# Patient Record
Sex: Female | Born: 1980 | Race: White | Hispanic: No | Smoking: Current every day smoker
Health system: Southern US, Community
[De-identification: ages and names within clinical notes are randomized; demographics above are authoritative.]

## PROBLEM LIST (undated history)

## (undated) DIAGNOSIS — R87629 Unspecified abnormal cytological findings in specimens from vagina: Secondary | ICD-10-CM

## (undated) DIAGNOSIS — E119 Type 2 diabetes mellitus without complications: Secondary | ICD-10-CM

## (undated) HISTORY — PX: ECTOPIC PREGNANCY SURGERY: SHX613

## (undated) HISTORY — DX: Unspecified abnormal cytological findings in specimens from vagina: R87.629

---

## 2010-05-29 ENCOUNTER — Ambulatory Visit: Payer: Self-pay | Admitting: Internal Medicine

## 2011-02-11 ENCOUNTER — Ambulatory Visit: Payer: Self-pay | Admitting: Internal Medicine

## 2012-08-27 ENCOUNTER — Ambulatory Visit: Payer: Self-pay

## 2013-06-17 ENCOUNTER — Ambulatory Visit: Payer: Self-pay | Admitting: Internal Medicine

## 2013-06-17 LAB — RAPID STREP-A WITH REFLX: Micro Text Report: NEGATIVE

## 2013-06-20 LAB — BETA STREP CULTURE(ARMC)

## 2013-08-31 ENCOUNTER — Ambulatory Visit: Payer: Self-pay | Admitting: Family Medicine

## 2013-09-06 ENCOUNTER — Encounter: Payer: Self-pay | Admitting: Family Medicine

## 2014-03-20 IMAGING — CR DG CHEST 1V
1 series · 1 of 1 positions shown · non-contrast
Comparison: none

REASON FOR EXAM: L rib pain
COMMENTS:

PROCEDURE:     MDR - MDR CHEST 1 VIEW AP OR PA  - August 27, 2012 [DATE]
RESULT:     Comparison: None.

[pa]
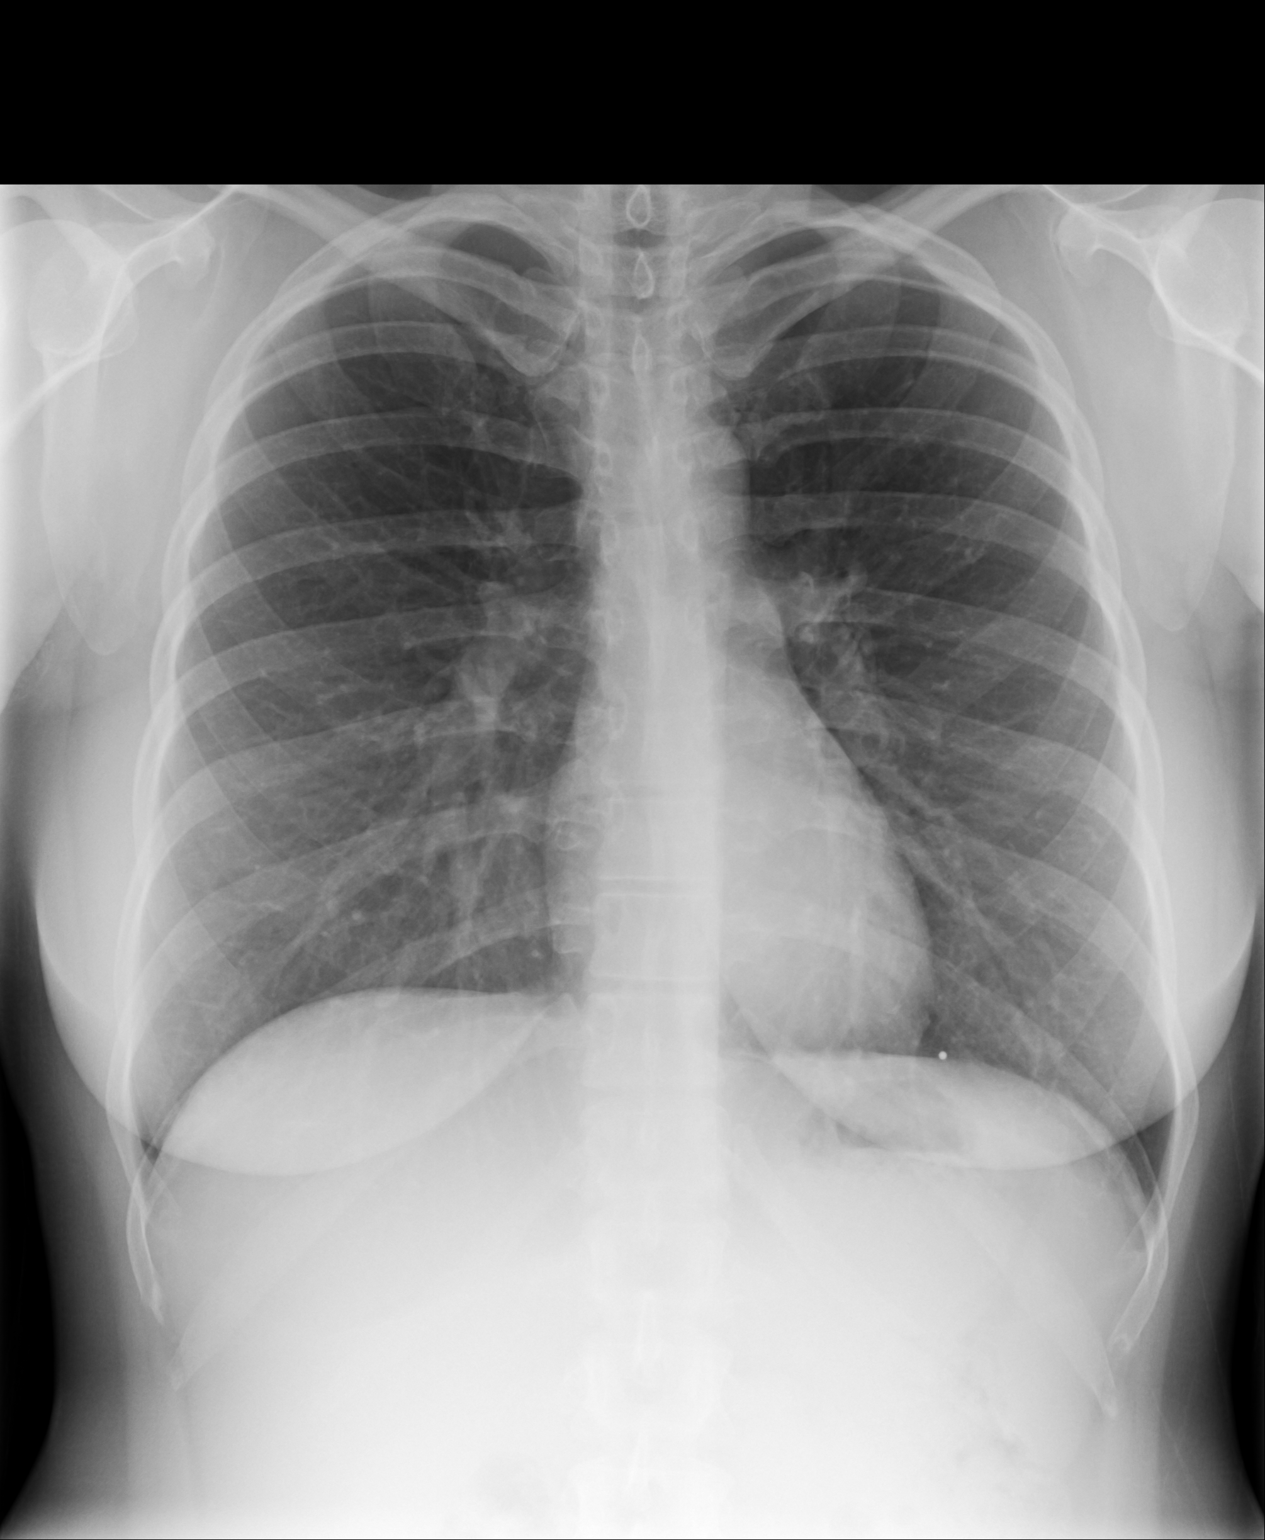

[1 of 1 positions shown; findings below may reference images not displayed]

FINDINGS: The heart and mediastinum are within normal limits. No focal pulmonary
opacities.
IMPRESSION: No acute cardiopulmonary disease.

[REDACTED]

## 2015-12-25 DIAGNOSIS — E119 Type 2 diabetes mellitus without complications: Secondary | ICD-10-CM | POA: Insufficient documentation

## 2016-04-22 DIAGNOSIS — N939 Abnormal uterine and vaginal bleeding, unspecified: Secondary | ICD-10-CM | POA: Insufficient documentation

## 2017-02-05 DIAGNOSIS — F325 Major depressive disorder, single episode, in full remission: Secondary | ICD-10-CM | POA: Insufficient documentation

## 2018-03-19 DIAGNOSIS — E119 Type 2 diabetes mellitus without complications: Secondary | ICD-10-CM | POA: Insufficient documentation

## 2018-10-29 DIAGNOSIS — Z720 Tobacco use: Secondary | ICD-10-CM | POA: Insufficient documentation

## 2019-02-22 ENCOUNTER — Ambulatory Visit
Admission: EM | Admit: 2019-02-22 | Discharge: 2019-02-22 | Disposition: A | Discharge status: Home / Self Care | Payer: Medicaid Other | Attending: Family Medicine | Admitting: Family Medicine

## 2019-02-22 ENCOUNTER — Other Ambulatory Visit: Payer: Self-pay

## 2019-02-22 DIAGNOSIS — N390 Urinary tract infection, site not specified: Secondary | ICD-10-CM | POA: Diagnosis present

## 2019-02-22 DIAGNOSIS — N760 Acute vaginitis: Secondary | ICD-10-CM | POA: Insufficient documentation

## 2019-02-22 DIAGNOSIS — B9689 Other specified bacterial agents as the cause of diseases classified elsewhere: Secondary | ICD-10-CM | POA: Insufficient documentation

## 2019-02-22 HISTORY — DX: Type 2 diabetes mellitus without complications: E11.9

## 2019-02-22 LAB — WET PREP, GENITAL
Sperm: NONE SEEN
Trich, Wet Prep: NONE SEEN
Yeast Wet Prep HPF POC: NONE SEEN

## 2019-02-22 LAB — URINALYSIS, COMPLETE (UACMP) WITH MICROSCOPIC
Bilirubin Urine: NEGATIVE
Glucose, UA: >1000 mg/dL — AB
Ketones, ur: NEGATIVE mg/dL
Leukocytes,Ua: NEGATIVE
Nitrite: NEGATIVE
Protein, ur: NEGATIVE mg/dL
Specific Gravity, Urine: 1.02 (ref 1.005–1.030)
pH: 5.5 (ref 5.0–8.0)

## 2019-02-22 MED ORDER — METRONIDAZOLE 500 MG PO TABS: 500.0000 mg | ORAL_TABLET | Freq: Two times a day (BID) | ORAL | 0 refills | Status: DC

## 2019-02-22 MED ORDER — CEPHALEXIN 500 MG PO CAPS: 500.0000 mg | ORAL_CAPSULE | Freq: Two times a day (BID) | ORAL | 0 refills | Status: AC

## 2019-02-22 NOTE — ED Provider Notes (Addendum)
[facility] ____________________________________________  Time seen: Approximately 11:51 AM  I have reviewed the triage vital signs and the nursing notes.   HISTORY  Chief Complaint Dysuria   HPI MORENIKE CUFF is a 38 y.o. female presenting for evaluation of 3 days of urinary frequency, urinary urgency and burning with urination.  Also reports noticing odor to urine but also odor vaginally as well.  Denies atypical vaginal discharge.  Currently finishing her menstrual cycle.  Expresses concern request gonorrhea and Chlamydia testing to be completed as her boyfriend recently cheated on her.  Declines HIV or RPR testing.  Denies abdominal pain, back pain, fevers, vomiting or diarrhea.  Continues to eat and drink well.  Denies other aggravating or alleviating factors.  Patient also reports blood sugars have been doing well, stating blood sugar has been in the "100s ".    Past Medical History:  Diagnosis Date  . Diabetes mellitus without complication (HCC)     There are no active problems to display for this patient.   Past Surgical History:  Procedure Laterality Date  . CESAREAN SECTION    . ECTOPIC PREGNANCY SURGERY       No current facility-administered medications for this encounter.   Current Outpatient Medications:  .  insulin glargine (LANTUS) 100 UNIT/ML injection, Take 50units  nightly., Disp: , Rfl:  .  liraglutide (VICTOZA) 18 MG/3ML SOPN, Inject into the skin., Disp: , Rfl:  .  cephALEXin (KEFLEX) 500 MG capsule, Take 1 capsule (500 mg total) by mouth 2 (two) times daily for 7 days., Disp: 14 capsule, Rfl: 0 .  metroNIDAZOLE (FLAGYL) 500 MG tablet, Take 1 tablet (500 mg total) by mouth 2 (two) times daily., Disp: 14 tablet, Rfl: 0  Allergies Metformin, Hydrocodone, and Hydrocodone-acetaminophen  History reviewed. No pertinent family history.  Social History Social History   Tobacco Use  . Smoking status: Current Every Day Smoker   Packs/day: 0.50    Types: Cigarettes  . Smokeless tobacco: Never Used  Substance Use Topics  . Alcohol use: Yes    Comment: social  . Drug use: Never    Review of Systems Constitutional: No fever ENT: No sore throat. Cardiovascular: Denies chest pain. Respiratory: Denies shortness of breath. Gastrointestinal: No abdominal pain.  No nausea, no vomiting.  No diarrhea.   Genitourinary: Positive for dysuria. Musculoskeletal: Negative for back pain. Skin: Negative for rash.   ____________________________________________   PHYSICAL EXAM:  VITAL SIGNS: ED Triage Vitals  Enc Vitals Group     BP 02/22/19 1105 126/84     Pulse Rate 02/22/19 1105 93     Resp 02/22/19 1105 16     Temp 02/22/19 1105 98.2 F (36.8 C)     Temp Source 02/22/19 1105 Oral     SpO2 02/22/19 1105 100 %     Weight 02/22/19 1107 135 lb (61.2 kg)     Height 02/22/19 1107 5' (1.524 m)     Head Circumference --      Peak Flow --      Pain Score 02/22/19 1106 10     Pain Loc --      Pain Edu? --      Excl. in GC? --     Constitutional: Alert and oriented. Well appearing and in no acute distress. Eyes: Conjunctivae are normal.  ENT      Head: Normocephalic and atraumatic. Cardiovascular: Normal rate, regular rhythm. Grossly normal heart sounds.  Good peripheral circulation. Respiratory: Normal respiratory effort without tachypnea  nor retractions. Breath sounds are clear and equal bilaterally. No wheezes, rales, rhonchi. Gastrointestinal: Soft and nontender.No CVA tenderness. Musculoskeletal: No midline cervical, thoracic or lumbar tenderness to palpation.  Neurologic:  Normal speech and language. Speech is normal. No gait instability.  Skin:  Skin is warm, dry and intact. No rash noted. Psychiatric: Mood and affect are normal. Speech and behavior are normal. Patient exhibits appropriate insight and judgment   ___________________________________________   LABS (all labs ordered are listed, but only  abnormal results are displayed)  Labs Reviewed  WET PREP, GENITAL - Abnormal; Notable for the following components:      Result Value   Clue Cells Wet Prep HPF POC PRESENT (*)    WBC, Wet Prep HPF POC FEW (*)    All other components within normal limits  URINALYSIS, COMPLETE (UACMP) WITH MICROSCOPIC - Abnormal; Notable for the following components:   Glucose, UA >1,000 (*)    Hgb urine dipstick SMALL (*)    Bacteria, UA FEW (*)    All other components within normal limits  GC/CHLAMYDIA PROBE AMP  URINE CULTURE   PROCEDURES Procedures   INITIAL IMPRESSION / ASSESSMENT AND PLAN / ED COURSE  Pertinent labs & imaging results that were available during my care of the patient were reviewed by me and considered in my medical decision making (see chart for details).  Well appearing patient. Elected for self wet prep and self gc/chlamydia. Urinalysis reviewed, will culture.  Wet prep positive for clue cells.  Will treat patient with oral Keflex and Flagyl.  Encourage rest, fluids, supportive care, pelvic rest.  Awaiting gonorrhea and Chlamydia testing.Discussed indication, risks and benefits of medications with patient.   Discussed follow up with Primary care physician this week as needed. Discussed follow up and return parameters including no resolution or any worsening concerns. Patient verbalized understanding and agreed to plan.   ____________________________________________   FINAL CLINICAL IMPRESSION(S) / ED DIAGNOSES  Final diagnoses:  Urinary tract infection without hematuria, site unspecified  Bacterial vaginosis     ED Discharge Orders         Ordered    metroNIDAZOLE (FLAGYL) 500 MG tablet  2 times daily     02/22/19 1147    cephALEXin (KEFLEX) 500 MG capsule  2 times daily     02/22/19 1147           Note: This dictation was prepared with Dragon dictation along with smaller phrase technology. Any transcriptional errors that result from this process are  unintentional.         Marylene Land, NP 02/22/19 1237    Marylene Land, NP 02/22/19 [phone]

## 2019-02-22 NOTE — ED Triage Notes (Addendum)
3 days of burning with urination. Pt reports her boyfriend cheated on her and she has had sex since then. Pt reports vaginal odor.

## 2019-02-22 NOTE — Discharge Instructions (Addendum)
Take medication as prescribed. Rest. Drink plenty of fluids. Pelvic rest.  ° °Follow up with your primary care physician this week as needed. Return to Urgent care for new or worsening concerns.  ° °

## 2019-02-24 LAB — URINE CULTURE: Culture: 60000 — AB

## 2019-02-25 LAB — GC/CHLAMYDIA PROBE AMP
Chlamydia trachomatis, NAA: POSITIVE — AB
Neisseria Gonorrhoeae by PCR: NEGATIVE

## 2019-02-26 ENCOUNTER — Telehealth: Payer: Self-pay | Admitting: Emergency Medicine

## 2019-02-26 MED ORDER — AZITHROMYCIN 250 MG PO TABS: 1000.0000 mg | ORAL_TABLET | Freq: Once | ORAL | 0 refills | Status: AC

## 2019-02-26 NOTE — Telephone Encounter (Signed)
Chlamydia is positive.  Rx po zithromax 1g #1 dose no refills was sent to the pharmacy of record.  Pt needs education to please refrain from sexual intercourse for 7 days to give the medicine time to work, sexual partners need to be notified and tested/treated.  Condoms may reduce risk of reinfection.  Recheck or followup with PCP for further evaluation if symptoms are not improving.   [facility] notified.  Patient contacted and made aware of    results. Pt verbalized understanding and had all questions answered.

## 2019-03-15 ENCOUNTER — Ambulatory Visit: Payer: Medicaid Other | Admitting: Physician Assistant

## 2019-03-15 ENCOUNTER — Other Ambulatory Visit: Payer: Self-pay

## 2019-03-15 DIAGNOSIS — Z113 Encounter for screening for infections with a predominantly sexual mode of transmission: Secondary | ICD-10-CM | POA: Diagnosis not present

## 2019-03-15 DIAGNOSIS — Z792 Long term (current) use of antibiotics: Secondary | ICD-10-CM

## 2019-03-15 LAB — WET PREP FOR TRICH, YEAST, CLUE
Trichomonas Exam: NEGATIVE
Yeast Exam: NEGATIVE

## 2019-03-15 MED ORDER — AZITHROMYCIN 500 MG PO TABS
1000.0000 mg | ORAL_TABLET | Freq: Once | ORAL | Status: AC
  Administered 2019-03-15: 1000 mg via ORAL

## 2019-03-15 NOTE — Progress Notes (Signed)
Wet prep reviewed-no Tx indicated; Azithromycin 1 gm po DOT per C. [place], [place] Debera Lat, [place]

## 2019-03-16 ENCOUNTER — Encounter: Payer: Self-pay | Admitting: Physician Assistant

## 2019-03-16 NOTE — Progress Notes (Signed)
STI clinic/screening visit  Subjective:  Beth Clarke is a 38 y.o. female being seen today for an STI screening visit. The patient reports they do have symptoms.  Patient reports that they do not desire a pregnancy in the next year.   They reported they are not interested in discussing contraception today.   Patient has the following medical conditions:  There are no active problems to display for this patient.    Chief Complaint  Patient presents with  . SEXUALLY TRANSMITTED DISEASE    HPI  Patient reports that she was treated for Chlamydia last Thursday but had sex with an untreated partner last night.  States that she has a vaginal odor today and she wants to be checked.  Using condoms for [facility] and declines to discuss BCM today.  LMP 02/11/2019 and normal.  See flowsheet for further details and programmatic requirements.    The following portions of the patient's history were reviewed and updated as appropriate: allergies, current medications, past medical history, past social history, past surgical history and problem list.  Objective:  There were no vitals filed for this visit.  Physical Exam Constitutional:      General: She is not in acute distress.    Appearance: Normal appearance. She is normal weight.  HENT:     Head: Normocephalic and atraumatic.     Comments: No nits, lice, or hair loss. No cervical, supraclavicular, or axillary adenopathy.    Mouth/Throat:     Mouth: Mucous membranes are moist.     Pharynx: Oropharynx is clear. No oropharyngeal exudate or posterior oropharyngeal erythema.  Eyes:     Conjunctiva/sclera: Conjunctivae normal.  Neck:     Musculoskeletal: Neck supple. No muscular tenderness.  Pulmonary:     Effort: Pulmonary effort is normal.  Abdominal:     Palpations: Abdomen is soft. There is no mass.     Tenderness: There is no abdominal tenderness. There is no guarding or rebound.  Genitourinary:    General: Normal vulva.   Rectum: Normal.     Comments: External genitalia/pubic area without nits, lice, edema, erythema, or inguinal adenopathy. Vagina with normal mucosa and small amount of clear discharge, pH=4.5. Cervix without visible lesions. Uterus firm, mobile, nt, no masses, no CMT, no adnexal tenderness or fullness. Skin:    General: Skin is warm and dry.     Findings: No bruising, erythema, lesion or rash.  Neurological:     Mental Status: She is alert and oriented to person, place, and time.  Psychiatric:        Mood and Affect: Mood normal.        Behavior: Behavior normal.        Thought Content: Thought content normal.        Judgment: Judgment normal.       Assessment and Plan:  Beth Clarke is a 38 y.o. female presenting to the [facility] Department for STI screening  1. Screening for STD (sexually transmitted disease) Patient into clinic for re-treatment. Rec condoms with all sex. - WET PREP FOR [place], YEAST, CLUE  2. Prophylactic antibiotic Counseled that we will need to re-treat her due to sex with untreated partner. Give Azithromycin 1 g po DOT today. No sex for 7 days and until after partner completes treatment. RTC if vomits < 2 hr after taking medicine. - azithromycin (ZITHROMAX) tablet 1,000 mg     No follow-ups on file.  No future appointments.  Jerene Dilling,  PA

## 2019-04-15 DIAGNOSIS — R21 Rash and other nonspecific skin eruption: Secondary | ICD-10-CM | POA: Insufficient documentation

## 2019-06-26 ENCOUNTER — Emergency Department
Admission: EM | Admit: 2019-06-26 | Discharge: 2019-06-26 | Discharge status: Against Medical Advice | Payer: Medicaid Other

## 2019-06-26 ENCOUNTER — Other Ambulatory Visit: Payer: Self-pay

## 2019-07-05 ENCOUNTER — Ambulatory Visit: Payer: Medicaid Other

## 2019-07-26 ENCOUNTER — Other Ambulatory Visit: Payer: Self-pay

## 2019-07-26 ENCOUNTER — Ambulatory Visit: Payer: Medicaid Other | Admitting: Physician Assistant

## 2019-07-26 ENCOUNTER — Encounter: Payer: Self-pay | Admitting: Physician Assistant

## 2019-07-26 DIAGNOSIS — Z113 Encounter for screening for infections with a predominantly sexual mode of transmission: Secondary | ICD-10-CM | POA: Diagnosis not present

## 2019-07-26 DIAGNOSIS — Z792 Long term (current) use of antibiotics: Secondary | ICD-10-CM

## 2019-07-26 LAB — WET PREP FOR TRICH, YEAST, CLUE
Trichomonas Exam: NEGATIVE
Yeast Exam: NEGATIVE

## 2019-07-26 MED ORDER — AZITHROMYCIN 500 MG PO TABS
1000.0000 mg | ORAL_TABLET | Freq: Once | ORAL | Status: AC
  Administered 2019-07-26: 11:00:00 1000 mg via ORAL

## 2019-07-26 NOTE — Progress Notes (Signed)
[facility] Department STI clinic/screening visit  Subjective:  Beth Clarke is a 39 y.o. female being seen today for an STI screening visit. The patient reports they do have symptoms.  Patient reports that they do not desire a pregnancy in the next year.   They reported they are not interested in discussing contraception today.  No LMP recorded.   Patient has the following medical conditions:  There are no problems to display for this patient.   Chief Complaint  Patient presents with  . SEXUALLY TRANSMITTED DISEASE    HPI  Patient reports that she was treated about 2 weeks ago for Chlamydia and had sex with an untreated partner.  States that she started having vaginal discharge again and would like retesting and re-treatment today.  Declines blood work today.  See flowsheet for further details and programmatic requirements.    The following portions of the patient's history were reviewed and updated as appropriate: allergies, current medications, past medical history, past social history, past surgical history and problem list.  Objective:  There were no vitals filed for this visit.  Physical Exam Constitutional:      General: She is not in acute distress.    Appearance: Normal appearance.  HENT:     Head: Normocephalic and atraumatic.     Comments: No nits, lice or hair loss. No cervical, supraclavicular or axillary adenopathy.    Mouth/Throat:     Mouth: Mucous membranes are moist.     Pharynx: Oropharynx is clear. No oropharyngeal exudate or posterior oropharyngeal erythema.  Eyes:     Conjunctiva/sclera: Conjunctivae normal.  Pulmonary:     Effort: Pulmonary effort is normal.  Abdominal:     Palpations: Abdomen is soft. There is no mass.     Tenderness: There is no abdominal tenderness. There is no guarding or rebound.  Genitourinary:    General: Normal vulva.     Rectum: Normal.     Comments: External genitalia/pubic area without nits, lice,  edema, erythema, lesions and inguinal adenopathy. Vagina with normal mucosa and discharge. Cervix without visible lesions. Uterus firm, mobile, nt, no masses, no CMT, no adnexal tenderness or fullness. Musculoskeletal:     Cervical back: Neck supple. No tenderness.  Skin:    General: Skin is warm and dry.     Findings: No bruising, erythema, lesion or rash.  Neurological:     Mental Status: She is alert and oriented to person, place, and time.  Psychiatric:        Mood and Affect: Mood normal.        Thought Content: Thought content normal.        Judgment: Judgment normal.      Assessment and Plan:  Beth Clarke is a 39 y.o. female presenting to the [facility] Department for STI screening  1. Screening for STD (sexually transmitted disease) Patient into clinic with symptoms.  Declines blood work today. Rec condoms with all sex. Await test results.  Counseled that RN will call if needs to RTC for further treatment once results are back.  - WET PREP FOR TRICH, YEAST, CLUE - Chlamydia/Gonorrhea [facility]  2. Prophylactic antibiotic Will re-treat for Chlamydia with Azithromycin 1g po DOT today. No sex for 7 days and until after partner completes treatment. RTC for re-treatment if vomits < 2 hr after completing medicine. - azithromycin (ZITHROMAX) tablet 1,000 mg     No follow-ups on file.  No future appointments.  Matt Holmes,  PA

## 2020-01-25 ENCOUNTER — Ambulatory Visit: Payer: Medicaid Other | Admitting: Physician Assistant

## 2020-01-25 ENCOUNTER — Other Ambulatory Visit: Payer: Self-pay

## 2020-01-25 ENCOUNTER — Encounter: Payer: Self-pay | Admitting: Physician Assistant

## 2020-01-25 DIAGNOSIS — B9689 Other specified bacterial agents as the cause of diseases classified elsewhere: Secondary | ICD-10-CM

## 2020-01-25 DIAGNOSIS — N760 Acute vaginitis: Secondary | ICD-10-CM | POA: Diagnosis not present

## 2020-01-25 DIAGNOSIS — Z113 Encounter for screening for infections with a predominantly sexual mode of transmission: Secondary | ICD-10-CM

## 2020-01-25 LAB — WET PREP FOR TRICH, YEAST, CLUE
Trichomonas Exam: NEGATIVE
Yeast Exam: NEGATIVE

## 2020-01-25 MED ORDER — METRONIDAZOLE 500 MG PO TABS: 500.0000 mg | ORAL_TABLET | Freq: Two times a day (BID) | ORAL | 0 refills | Status: AC

## 2020-01-25 NOTE — Progress Notes (Signed)
Pt is here for STD screening. 

## 2020-01-25 NOTE — Progress Notes (Signed)
Wet [place] results reviewed by provider C. [place], PA. Patient treated for BV per provider orders. Vienna Folden, RN  

## 2020-01-27 ENCOUNTER — Encounter: Payer: Self-pay | Admitting: Physician Assistant

## 2020-01-27 NOTE — Progress Notes (Signed)
  [facility] Department STI clinic/screening visit  Subjective:  Beth Clarke is a 39 y.o. female being seen today for an STI screening visit. The patient reports they do have symptoms.  Patient reports that they do not desire a pregnancy in the next year.   They reported they are not interested in discussing contraception today.  Patient's last menstrual period was 01/13/2020 (exact date).   Patient has the following medical conditions:  There are no problems to display for this patient.   Chief Complaint  Patient presents with  . SEXUALLY TRANSMITTED DISEASE    screening    HPI  Patient reports that she has had a yellowish discharge with slight odor for 2 days.  Denies other symptoms.  States that she had a HIV test earlier this year and last pap was in 2019.  Using condoms as her BCM.   See flowsheet for further details and programmatic requirements.    The following portions of the patient's history were reviewed and updated as appropriate: allergies, current medications, past medical history, past social history, past surgical history and problem list.  Objective:  There were no vitals filed for this visit.  Physical Exam Constitutional:      General: She is not in acute distress.    Appearance: Normal appearance.  HENT:     Head: Normocephalic and atraumatic.  Eyes:     Conjunctiva/sclera: Conjunctivae normal.  Pulmonary:     Effort: Pulmonary effort is normal.  Skin:    General: Skin is warm and dry.     Findings: No bruising, erythema, lesion or rash.  Neurological:     Mental Status: She is alert and oriented to person, place, and time.  Psychiatric:        Mood and Affect: Mood normal.        Behavior: Behavior normal.        Thought Content: Thought content normal.        Judgment: Judgment normal.      Assessment and Plan:  Beth Clarke is a 39 y.o. female presenting to the [facility] Department for STI  screening  1. Screening for STD (sexually transmitted disease) Patient into clinic with symptoms. Patient declines blood work today.  Patient requests to self-collect vaginal samples for testing.  Counseled how to collect for accurate results.   Rec condoms with all sex. Await test results.  Counseled that RN will call if needs to RTC for treatment once results are back. - WET PREP FOR TRICH, YEAST, CLUE - Chlamydia/Gonorrhea [place] [facility]  2. BV (bacterial vaginosis) Will treat for BV with Metronidazole 500 mg # 14 1 po BID for 7 days with food, no EtOH for 24 hr before and until 72 hr after completing medicine. No sex for 7 days. Enc to use OTC antifungal cream if has itching during or just after treatment with antibiotic. - metroNIDAZOLE (FLAGYL) 500 MG tablet; Take 1 tablet (500 mg total) by mouth 2 (two) times daily for 7 days.  Dispense: 14 tablet; Refill: 0     No follow-ups on file.  No future appointments.  Matt Holmes, PA

## 2020-02-02 ENCOUNTER — Telehealth: Payer: Self-pay

## 2020-02-02 NOTE — Telephone Encounter (Signed)
Call to patient. Informed patient of positive gonorrhea result for 01/25/2020 appt. Patient instructed to eat before appt. Patient also is getting partner to make appt.   Harvie Heck, RN

## 2020-02-03 ENCOUNTER — Ambulatory Visit: Payer: Self-pay | Admitting: Physician Assistant

## 2020-02-03 ENCOUNTER — Other Ambulatory Visit: Payer: Self-pay

## 2020-02-03 DIAGNOSIS — A549 Gonococcal infection, unspecified: Secondary | ICD-10-CM

## 2020-02-03 MED ORDER — CEFTRIAXONE SODIUM 500 MG IJ SOLR
500.0000 mg | Freq: Once | INTRAMUSCULAR | Status: AC
  Administered 2020-02-03: 500 mg via INTRAMUSCULAR

## 2020-02-03 NOTE — Progress Notes (Signed)
Patient into clinic for treatment only.  See RN note re:  Treatment and counseling.

## 2020-02-03 NOTE — Progress Notes (Signed)
Patient into clinic for treatment of Gonorrhea. Patient treated for above per standing orders. Tolerated well. Tawny Hopping, RN

## 2020-04-21 ENCOUNTER — Ambulatory Visit: Payer: Medicaid Other

## 2020-05-19 ENCOUNTER — Ambulatory Visit: Payer: Medicaid Other | Admitting: Physician Assistant

## 2020-05-19 ENCOUNTER — Other Ambulatory Visit: Payer: Self-pay

## 2020-05-19 DIAGNOSIS — Z113 Encounter for screening for infections with a predominantly sexual mode of transmission: Secondary | ICD-10-CM

## 2020-05-19 DIAGNOSIS — Z202 Contact with and (suspected) exposure to infections with a predominantly sexual mode of transmission: Secondary | ICD-10-CM

## 2020-05-19 LAB — WET PREP FOR TRICH, YEAST, CLUE
Trichomonas Exam: NEGATIVE
Yeast Exam: NEGATIVE

## 2020-05-19 MED ORDER — AZITHROMYCIN 500 MG PO TABS
1000.0000 mg | ORAL_TABLET | Freq: Once | ORAL | Status: AC
  Administered 2020-05-19: 1000 mg via ORAL

## 2020-05-19 NOTE — Progress Notes (Signed)
Post:  Wet mount reviewed with patient. No tx per S.O. Provider orders complete.   Harvie Heck, RN

## 2020-05-19 NOTE — Progress Notes (Signed)
Pt returned to clinic with contact card for NGU. Provider reviewed request for treatment. Per provider orders, pt treated with Azithromycin 1 gram po DOT. Provider orders completed.

## 2020-05-20 ENCOUNTER — Encounter: Payer: Self-pay | Admitting: Physician Assistant

## 2020-05-20 NOTE — Progress Notes (Signed)
[facility] Department STI clinic/screening visit  Subjective:  Beth Clarke is a 40 y.o. female being seen today for an STI screening visit. The patient reports they do not have symptoms.  Patient reports that they do not desire a pregnancy in the next year.   They reported they are not interested in discussing contraception today.  Patient's last menstrual period was 04/28/2020 (exact date).   Patient has the following medical conditions:   Patient Active Problem List   Diagnosis Date Noted  . Tobacco abuse 10/29/2018  . Diabetes mellitus (HCC) 03/19/2018  . Major depressive disorder with single episode, in full remission (HCC) 02/05/2017    Chief Complaint  Patient presents with  . SEXUALLY TRANSMITTED DISEASE    screening    HPI  Patient reports that she was seen at a clinic in [facility] about 1 month ago and treated for PID but all of her tests were negative and would like a recheck today to make sure everything is OK.  Reports she completed all of her medicines about 2 weeks ago.  States last HIV test was about 1 year ago and last pap was last year.  States that she takes Insulin for DM as directed.  Is currently using condoms as her BCM.  See flowsheet for further details and programmatic requirements.    The following portions of the patient's history were reviewed and updated as appropriate: allergies, current medications, past medical history, past social history, past surgical history and problem list.  Objective:  There were no vitals filed for this visit.  Physical Exam Constitutional:      General: She is not in acute distress.    Appearance: Normal appearance.  HENT:     Head: Normocephalic and atraumatic.     Comments: No nits,lice, or hair loss. No cervical, supraclavicular or axillary adenopathy.    Mouth/Throat:     Mouth: Mucous membranes are moist.     Pharynx: Oropharynx is clear. No oropharyngeal exudate or posterior  oropharyngeal erythema.  Eyes:     Conjunctiva/sclera: Conjunctivae normal.  Pulmonary:     Effort: Pulmonary effort is normal.  Abdominal:     Palpations: Abdomen is soft. There is no mass.     Tenderness: There is no abdominal tenderness. There is no guarding or rebound.  Genitourinary:    General: Normal vulva.     Rectum: Normal.     Comments: External genitalia/pubic area without nits, lice, edema, erythema, lesions and inguinal adenopathy. Vagina with normal mucosa and discharge. Cervix without visible lesions. Uterus firm, mobile, nt, no masses, no CMT, no adnexal tenderness or fullness. Musculoskeletal:     Cervical back: Neck supple. No tenderness.  Skin:    General: Skin is warm and dry.     Findings: No bruising, erythema, lesion or rash.  Neurological:     Mental Status: She is alert and oriented to person, place, and time.  Psychiatric:        Mood and Affect: Mood normal.        Behavior: Behavior normal.        Thought Content: Thought content normal.        Judgment: Judgment normal.      Assessment and Plan:  Beth Clarke is a 40 y.o. female presenting to the [facility] Department for STI screening  1. Screening for STD (sexually transmitted disease) Patient into clinic without symptoms. Patient declines blood work today.  Rec condoms with all sex.  Await test results.  Counseled that RN will call if needs to RTC for treatment once results are back. - WET PREP FOR TRICH, YEAST, CLUE - Chlamydia/Gonorrhea  Lab  2. Venereal disease contact Patient returned after posting and states partner just notified her he was treated for NGU and she would like treatment as a contact today. Reviewed patient chart and will treat as a contact to NGU with Azithromycin 1 g po DOT today. No sex for 14 days. RTC for re-treatment if vomits < 2 hr after treatment. - azithromycin (ZITHROMAX) tablet 1,000 mg     No follow-ups on file.  No future  appointments.  Matt Holmes, PA

## 2020-07-02 ENCOUNTER — Other Ambulatory Visit: Payer: Self-pay

## 2020-07-02 ENCOUNTER — Encounter: Payer: Self-pay | Admitting: Emergency Medicine

## 2020-07-02 ENCOUNTER — Ambulatory Visit
Admission: EM | Admit: 2020-07-02 | Discharge: 2020-07-02 | Disposition: A | Discharge status: Home / Self Care | Payer: Medicaid Other | Attending: Emergency Medicine | Admitting: Emergency Medicine

## 2020-07-02 DIAGNOSIS — N750 Cyst of Bartholin's gland: Secondary | ICD-10-CM | POA: Diagnosis not present

## 2020-07-02 MED ORDER — FLUCONAZOLE 200 MG PO TABS: 200.0000 mg | ORAL_TABLET | Freq: Every day | ORAL | 0 refills | Status: AC

## 2020-07-02 MED ORDER — SULFAMETHOXAZOLE-TRIMETHOPRIM 800-160 MG PO TABS: 1.0000 | ORAL_TABLET | Freq: Two times a day (BID) | ORAL | 0 refills | Status: AC

## 2020-07-02 MED ORDER — OXYCODONE-ACETAMINOPHEN 5-325 MG PO TABS: 1.0000 | ORAL_TABLET | Freq: Four times a day (QID) | ORAL | 0 refills | Status: DC | PRN

## 2020-07-02 NOTE — ED Triage Notes (Signed)
Patient states that she was shaving her genital area couple of days ago and then noticed a tender abscess on the right side of her labia for the past 5 days.  Patient denies fevers. Patient denies any drainage.

## 2020-07-02 NOTE — ED Provider Notes (Signed)
[facility]    CSN: [id number] Arrival date & time: 07/02/20  1218      History   Chief Complaint Chief Complaint  Patient presents with  . Abscess    HPI Beth Clarke is a 40 y.o. female.   HPI   40 year old female here for evaluation of swelling to her right labia.  Patient reports that she noticed a swollen, tender area on her right labia while shaving 5 days ago.  She thought that it might be an ingrown hair so she has been squeezing on it trying to get it to pop up which is just made it more painful and more swollen.  Patient states she has not had any fever or drainage from the area but she is complaining of pain and swelling up into her right groin.  Past Medical History:  Diagnosis Date  . Diabetes mellitus without complication [facility])     Patient Active Problem List   Diagnosis Date Noted  . Tobacco abuse 10/29/2018  . Diabetes mellitus (HCC) 03/19/2018  . Major depressive disorder with single episode, in full remission (HCC) 02/05/2017    Past Surgical History:  Procedure Laterality Date  . CESAREAN SECTION    . ECTOPIC PREGNANCY SURGERY      OB History   No obstetric history on file.      Home Medications    Prior to Admission medications   Medication Sig Start Date End Date Taking? Authorizing Provider  fluconazole (DIFLUCAN) 200 MG tablet Take 1 tablet (200 mg total) by mouth daily for 2 doses. 07/02/20 07/04/20 Yes Becky Augusta, NP  insulin glargine (LANTUS) 100 UNIT/ML injection Take 50units [place] nightly. 12/11/18  Yes [provider]  liraglutide (VICTOZA) 18 MG/3ML SOPN Inject into the skin. 11/11/18 07/02/20 Yes [provider]  oxyCODONE-acetaminophen (PERCOCET/ROXICET) 5-325 MG tablet Take 1 tablet by mouth every 6 (six) hours as needed for severe pain. 07/02/20  Yes Becky Augusta, NP  sulfamethoxazole-trimethoprim (BACTRIM DS) 800-160 MG tablet Take 1 tablet by mouth 2 (two) times daily for 7 days. 07/02/20 07/09/20 Yes  Becky Augusta, NP    Family History History reviewed. No pertinent family history.  Social History Social History   Tobacco Use  . Smoking status: Current Every Day Smoker    Packs/day: 0.50    Types: Cigarettes  . Smokeless tobacco: Never Used  Vaping Use  . Vaping Use: Some days  Substance Use Topics  . Alcohol use: Yes    Comment: social  . Drug use: Never     Allergies   Metformin, Hydrocodone, and Hydrocodone-acetaminophen   Review of Systems Review of Systems  Constitutional: Negative for fever.  Genitourinary: Positive for vaginal pain. Negative for vaginal bleeding and vaginal discharge.  Skin: Positive for color change. Negative for rash.  Hematological: Positive for adenopathy.  Psychiatric/Behavioral: Negative.      Physical Exam Triage Vital Signs ED Triage Vitals  Enc Vitals Group     BP 07/02/20 1240 115/78     Pulse Rate 07/02/20 1240 93     Resp 07/02/20 1240 14     Temp 07/02/20 1240 98.3 F (36.8 C)     Temp Source 07/02/20 1240 Oral     SpO2 07/02/20 1240 98 %     Weight 07/02/20 1237 134 lb 14.7 oz (61.2 kg)     Height 07/02/20 1237 5' (1.524 m)     Head Circumference --      Peak Flow --  Pain Score 07/02/20 1237 7     Pain Loc --      Pain Edu? --      Excl. in GC? --    No data found.  Updated Vital Signs BP 115/78 (BP Location: Left Arm)   Pulse 93   Temp 98.3 F (36.8 C) (Oral)   Resp 14   Ht 5' (1.524 m)   Wt 134 lb 14.7 oz (61.2 kg)   LMP 06/11/2020 (Approximate)   SpO2 98%   BMI 26.35 kg/m   Visual Acuity Right Eye Distance:   Left Eye Distance:   Bilateral Distance:    Right Eye Near:   Left Eye Near:    Bilateral Near:     Physical Exam Vitals and nursing note reviewed. Exam conducted with a chaperone present Zenon Mayo, CMA).  Constitutional:      General: She is not in acute distress.    Appearance: Normal appearance. She is normal weight.  HENT:     Head: Normocephalic and atraumatic.   Genitourinary:    Vagina: No vaginal discharge.  Skin:    General: Skin is warm and dry.     Capillary Refill: Capillary refill takes less than 2 seconds.     Findings: Erythema present. No rash.  Neurological:     General: No focal deficit present.     Mental Status: She is alert and oriented to person, place, and time.  Psychiatric:        Mood and Affect: Mood normal.        Behavior: Behavior normal.        Thought Content: Thought content normal.        Judgment: Judgment normal.      UC Treatments / Results  Labs (all labs ordered are listed, but only abnormal results are displayed) Labs Reviewed - No data to display  EKG   Radiology No results found.  Procedures Procedures (including critical care time)  Medications Ordered in UC Medications - No data to display  Initial Impression / Assessment and Plan / UC Course  I have reviewed the triage vital signs and the nursing notes.  Pertinent labs & imaging results that were available during my care of the patient were reviewed by me and considered in my medical decision making (see chart for details).   Patient is a very pleasant 40 year old female here for evaluation of painful swelling on her right labia.  The swelling has been present for last 5 days and patient has been squeezing it tried to get it rupture without any success.  She reports that the squeezing has just made it more painful and more swollen.  Patient's perineum examined in the presence of a chaperone revealed an erythematous and edematous area in the middle of her left labia.  Patient also has some shotty lymphadenopathy that is tender to palpation in her right inguinal region.  Patient's exam is consistent with a Bartholin cyst.  Patient advised that we do not have Word catheter is here and that she should contact her OB GYN for incision and drainage.  Will provide patient with pain medication and place her on Bactrim.  Patient states she also gets  yeast infections from antibiotics so we will give a empiric prescription for Diflucan.   Final Clinical Impressions(s) / UC Diagnoses   Final diagnoses:  Bartholin cyst     Discharge Instructions     Do not squeeze the swelling in your right labia any further.  You may  apply warm compresses to the area frequently to try to encourage it to drain on its own.  Take the Bactrim twice daily with a full glass of water for treatment of the infection.  Use the Percocet every 6 hours as needed for severe pain and take over-the-counter ibuprofen as needed for mild to moderate pain.  If you develop signs and symptoms of yeast infection take a Diflucan tablet and you can repeat in 1 week as needed.  Contact your OB/GYN on Monday for an appointment to have your Bartholin cyst lanced.    ED Prescriptions    Medication Sig Dispense Auth. Provider   sulfamethoxazole-trimethoprim (BACTRIM DS) 800-160 MG tablet Take 1 tablet by mouth 2 (two) times daily for 7 days. 14 tablet Becky Augusta, NP   oxyCODONE-acetaminophen (PERCOCET/ROXICET) 5-325 MG tablet Take 1 tablet by mouth every 6 (six) hours as needed for severe pain. 15 tablet Becky Augusta, NP   fluconazole (DIFLUCAN) 200 MG tablet Take 1 tablet (200 mg total) by mouth daily for 2 doses. 2 tablet Becky Augusta, NP     I have reviewed the PDMP during this encounter.   Becky Augusta, NP 07/02/20 1314

## 2020-07-02 NOTE — Discharge Instructions (Signed)
Do not squeeze the swelling in your right labia any further.  You may apply warm compresses to the area frequently to try to encourage it to drain on its own.  Take the Bactrim twice daily with a full glass of water for treatment of the infection.  Use the Percocet every 6 hours as needed for severe pain and take over-the-counter ibuprofen as needed for mild to moderate pain.  If you develop signs and symptoms of yeast infection take a Diflucan tablet and you can repeat in 1 week as needed.  Contact your OB/GYN on Monday for an appointment to have your Bartholin cyst lanced.

## 2020-07-04 ENCOUNTER — Emergency Department
Admission: EM | Admit: 2020-07-04 | Discharge: 2020-07-04 | Disposition: A | Discharge status: Home / Self Care | Payer: Medicaid Other | Attending: Emergency Medicine | Admitting: Emergency Medicine

## 2020-07-04 ENCOUNTER — Other Ambulatory Visit: Payer: Self-pay

## 2020-07-04 DIAGNOSIS — Z5321 Procedure and treatment not carried out due to patient leaving prior to being seen by health care provider: Secondary | ICD-10-CM | POA: Diagnosis not present

## 2020-07-04 DIAGNOSIS — N751 Abscess of Bartholin's gland: Secondary | ICD-10-CM | POA: Insufficient documentation

## 2020-07-04 NOTE — ED Triage Notes (Signed)
Pt states she went to urgent care yesterday for bartholin cyst states has had it over a week. Urgent care would not drain it due to not having the needed supplies.

## 2020-11-07 ENCOUNTER — Encounter: Payer: Self-pay | Admitting: Advanced Practice Midwife

## 2020-11-07 ENCOUNTER — Ambulatory Visit: Payer: Medicaid Other | Admitting: Advanced Practice Midwife

## 2020-11-07 ENCOUNTER — Other Ambulatory Visit: Payer: Self-pay

## 2020-11-07 DIAGNOSIS — Z113 Encounter for screening for infections with a predominantly sexual mode of transmission: Secondary | ICD-10-CM | POA: Diagnosis not present

## 2020-11-07 DIAGNOSIS — Z62810 Personal history of physical and sexual abuse in childhood: Secondary | ICD-10-CM

## 2020-11-07 HISTORY — DX: Personal history of physical and sexual abuse in childhood: Z62.810

## 2020-11-07 LAB — WET PREP FOR TRICH, YEAST, CLUE
Trichomonas Exam: NEGATIVE
Yeast Exam: NEGATIVE

## 2020-11-07 NOTE — Progress Notes (Signed)
Pt here for STD screening.  Wet mount results reviewed, no treatment required. Pt given condoms. Narvel Kozub M Jeanice Dempsey, RN  

## 2020-11-07 NOTE — Progress Notes (Signed)
[facility] Department STI clinic/screening visit  Subjective:  Beth Clarke is a 40 y.o. SWF G6P5 smoker female being seen today for an STI screening visit. The patient reports they do have symptoms.  Patient reports that they do not desire a pregnancy in the next year.   They reported they are not interested in discussing contraception today.  Patient's last menstrual period was 10/29/2020 (exact date).   Patient has the following medical conditions:   Patient Active Problem List   Diagnosis Date Noted   Tobacco abuse 10/29/2018   Diabetes mellitus (HCC) 03/19/2018   Major depressive disorder with single episode, in full remission (HCC) 02/05/2017    Chief Complaint  Patient presents with   SEXUALLY TRANSMITTED DISEASE    Screening    HPI  Patient reports dysuria 2 days ago; also has new partner. LMP 10/29/20. Last sex last night without condom; with current partner x 1 mo; 2 sex partners in last 3 mo. Smoking 1 ppd. Last vaped 2020. Last MJ age 75. Last douched this am.   Last HIV test per patient/review of record was 08/26/18 Patient reports last pap was 2020 neg  See flowsheet for further details and programmatic requirements.    The following portions of the patient's history were reviewed and updated as appropriate: allergies, current medications, past medical history, past social history, past surgical history and problem list.  Objective:  There were no vitals filed for this visit.  Physical Exam Vitals and nursing note reviewed.  Constitutional:      Appearance: Normal appearance. She is normal weight.  HENT:     Head: Normocephalic and atraumatic.     Mouth/Throat:     Mouth: Mucous membranes are moist.     Pharynx: Oropharynx is clear. No oropharyngeal exudate or posterior oropharyngeal erythema.  Pulmonary:     Effort: Pulmonary effort is normal.  Chest:  Breasts:    Right: No axillary adenopathy or supraclavicular adenopathy.     Left:  No axillary adenopathy or supraclavicular adenopathy.  Abdominal:     General: Abdomen is flat.     Palpations: There is no mass.     Tenderness: There is no abdominal tenderness. There is no rebound.     Comments: Poor tone, soft without masses or tenderness  Genitourinary:    General: Normal vulva.     Exam position: Lithotomy position.     Pubic Area: No rash or pubic lice.      Labia:        Right: No rash or lesion.        Left: No rash or lesion.      Vagina: Normal. No vaginal discharge (white creamy leukorrhea, ph>4.5), erythema, bleeding or lesions.     Cervix: Normal.     Uterus: Normal.      Adnexa: Right adnexa normal and left adnexa normal.     Rectum: Normal.  Lymphadenopathy:     Head:     Right side of head: No preauricular or posterior auricular adenopathy.     Left side of head: No preauricular or posterior auricular adenopathy.     Cervical: No cervical adenopathy.     Upper Body:     Right upper body: No supraclavicular or axillary adenopathy.     Left upper body: No supraclavicular or axillary adenopathy.     Lower Body: No right inguinal adenopathy. No left inguinal adenopathy.  Skin:    General: Skin is warm and dry.  Findings: No rash.  Neurological:     Mental Status: She is alert and oriented to person, place, and time.     Assessment and Plan:  Beth Clarke is a 40 y.o. female presenting to the [facility] Department for STI screening  1. Screening examination for venereal disease Treat wet mount per standing orders Immunization nurse consult - WET PREP FOR TRICH, YEAST, CLUE - Chlamydia/Gonorrhea [facility]     No follow-ups on file.  No future appointments.  Alberteen Spindle, CNM

## 2020-11-10 ENCOUNTER — Telehealth: Payer: Self-pay

## 2020-11-10 NOTE — Telephone Encounter (Signed)
PC to pt to advise of positive test results for gonorrhea.Advised pt recommended treatment. Pt requested appt Monday. Appt made for Monday 8/8 at 10:15am.

## 2020-11-14 ENCOUNTER — Ambulatory Visit: Payer: Medicaid Other | Admitting: Nurse Practitioner

## 2020-11-14 ENCOUNTER — Other Ambulatory Visit: Payer: Self-pay

## 2020-11-14 DIAGNOSIS — Z113 Encounter for screening for infections with a predominantly sexual mode of transmission: Secondary | ICD-10-CM

## 2020-11-14 DIAGNOSIS — A549 Gonococcal infection, unspecified: Secondary | ICD-10-CM

## 2020-11-14 MED ORDER — CEFTRIAXONE SODIUM 500 MG IJ SOLR
500.0000 mg | Freq: Once | INTRAMUSCULAR | Status: AC
  Administered 2020-11-14: 500 mg via INTRAMUSCULAR

## 2020-11-14 NOTE — Progress Notes (Signed)
Patient in clinic today for treatment.  Patient is positive for Gonorrhea. Glenna Fellows, RN

## 2021-02-02 ENCOUNTER — Ambulatory Visit: Payer: Medicaid Other | Admitting: Family Medicine

## 2021-02-02 ENCOUNTER — Other Ambulatory Visit: Payer: Self-pay

## 2021-02-02 DIAGNOSIS — Z113 Encounter for screening for infections with a predominantly sexual mode of transmission: Secondary | ICD-10-CM

## 2021-02-02 DIAGNOSIS — B9689 Other specified bacterial agents as the cause of diseases classified elsewhere: Secondary | ICD-10-CM

## 2021-02-02 LAB — WET PREP FOR TRICH, YEAST, CLUE
Trichomonas Exam: NEGATIVE
Yeast Exam: NEGATIVE

## 2021-02-02 MED ORDER — METRONIDAZOLE 500 MG PO TABS: 500.0000 mg | ORAL_TABLET | Freq: Two times a day (BID) | ORAL | 0 refills | Status: AC

## 2021-02-02 NOTE — Progress Notes (Signed)
Pt here for STD screening.  Pt states that she has burning with urination and "a yellow" vaginal discharge.  Wet mount results reviewed and medication dispensed per So.  Pt declined bloodwork  and condoms.  Berdie Ogren, RN

## 2021-02-12 NOTE — Progress Notes (Signed)
Attestation of Attending Supervision of clinical support staff: I agree with the care provided to this patient and was available for any consultation.  I have reviewed the RN's note and chart.I was off site on the date of service and all providers that would typically provide services called off. I was consulted to assure appropriate continuation of services for [facility] clients.   Client met criteria for BV treatment with +discharge, + clue and amine.    Caren Macadam, MD, MPH, [facility] Medical Director

## 2021-03-10 ENCOUNTER — Encounter: Payer: Self-pay | Admitting: Emergency Medicine

## 2021-03-10 ENCOUNTER — Other Ambulatory Visit: Payer: Self-pay

## 2021-03-10 ENCOUNTER — Emergency Department
Admission: EM | Admit: 2021-03-10 | Discharge: 2021-03-11 | Disposition: A | Discharge status: Home / Self Care | Payer: Medicaid Other | Attending: Emergency Medicine | Admitting: Emergency Medicine

## 2021-03-10 DIAGNOSIS — E1165 Type 2 diabetes mellitus with hyperglycemia: Secondary | ICD-10-CM | POA: Insufficient documentation

## 2021-03-10 DIAGNOSIS — F17210 Nicotine dependence, cigarettes, uncomplicated: Secondary | ICD-10-CM | POA: Diagnosis not present

## 2021-03-10 DIAGNOSIS — R42 Dizziness and giddiness: Secondary | ICD-10-CM | POA: Diagnosis present

## 2021-03-10 DIAGNOSIS — Z794 Long term (current) use of insulin: Secondary | ICD-10-CM | POA: Insufficient documentation

## 2021-03-10 LAB — BASIC METABOLIC PANEL
Anion gap: 9 (ref 5–15)
Anion gap: 6 (ref 5–15)
BUN: 11 mg/dL (ref 6–20)
BUN: 12 mg/dL (ref 6–20)
CO2: 28 mmol/L (ref 22–32)
CO2: 27 mmol/L (ref 22–32)
Calcium: 9.2 mg/dL (ref 8.9–10.3)
Calcium: 8.3 mg/dL — ABNORMAL LOW (ref 8.9–10.3)
Chloride: 89 mmol/L — ABNORMAL LOW (ref 98–111)
Chloride: 97 mmol/L — ABNORMAL LOW (ref 98–111)
Creatinine, Ser: 0.75 mg/dL (ref 0.44–1.00)
Creatinine, Ser: 0.56 mg/dL (ref 0.44–1.00)
GFR, Estimated: >60 mL/min (ref >60)
GFR, Estimated: >60 mL/min (ref >60)
Glucose, Bld: 748 mg/dL (ref 70–99)
Glucose, Bld: 250 mg/dL — ABNORMAL HIGH (ref 70–99)
Potassium: 4.7 mmol/L (ref 3.5–5.1)
Potassium: 4.3 mmol/L (ref 3.5–5.1)
Sodium: 126 mmol/L — ABNORMAL LOW (ref 135–145)
Sodium: 130 mmol/L — ABNORMAL LOW (ref 135–145)

## 2021-03-10 LAB — CBG MONITORING, ED
Glucose-Capillary: >600 mg/dL (ref 70–99)
Glucose-Capillary: 225 mg/dL — ABNORMAL HIGH (ref 70–99)

## 2021-03-10 LAB — CBC
HCT: 44.7 % (ref 36.0–46.0)
Hemoglobin: 14.6 g/dL (ref 12.0–15.0)
MCH: 29.2 pg (ref 26.0–34.0)
MCHC: 32.7 g/dL (ref 30.0–36.0)
MCV: 89.4 fL (ref 80.0–100.0)
Platelets: 282 10*3/uL (ref 150–400)
RBC: 5 MIL/uL (ref 3.87–5.11)
RDW: 13.9 % (ref 11.5–15.5)
WBC: 11.6 10*3/uL — ABNORMAL HIGH (ref 4.0–10.5)
nRBC: 0 % (ref 0.0–0.2)

## 2021-03-10 LAB — HEPATIC FUNCTION PANEL
ALT: 18 U/L (ref 0–44)
AST: 22 U/L (ref 15–41)
Albumin: 3.6 g/dL (ref 3.5–5.0)
Alkaline Phosphatase: 125 U/L (ref 38–126)
Bilirubin, Direct: <0.1 mg/dL (ref 0.0–0.2)
Total Bilirubin: 0.4 mg/dL (ref 0.3–1.2)
Total Protein: 7.6 g/dL (ref 6.5–8.1)

## 2021-03-10 LAB — BETA-HYDROXYBUTYRIC ACID: Beta-Hydroxybutyric Acid: 0.14 mmol/L (ref 0.05–0.27)

## 2021-03-10 LAB — URINALYSIS, ROUTINE W REFLEX MICROSCOPIC
Bacteria, UA: NONE SEEN
Bilirubin Urine: NEGATIVE
Glucose, UA: >1000 mg/dL — AB
Ketones, ur: NEGATIVE mg/dL
Leukocytes,Ua: NEGATIVE
Nitrite: POSITIVE — AB
Protein, ur: NEGATIVE mg/dL
Specific Gravity, Urine: 1.01 (ref 1.005–1.030)
pH: 6 (ref 5.0–8.0)

## 2021-03-10 LAB — LIPASE, BLOOD: Lipase: 20 U/L (ref 11–51)

## 2021-03-10 MED ORDER — ACETAMINOPHEN 500 MG PO TABS
1000.0000 mg | ORAL_TABLET | Freq: Once | ORAL | Status: AC
  Administered 2021-03-10: 1000 mg via ORAL
  Filled 2021-03-10: qty 2

## 2021-03-10 MED ORDER — LACTATED RINGERS IV BOLUS
2000.0000 mL | Freq: Once | INTRAVENOUS | Status: AC
  Administered 2021-03-10: 2000 mL via INTRAVENOUS

## 2021-03-10 MED ORDER — POTASSIUM CHLORIDE CRYS ER 20 MEQ PO TBCR
60.0000 meq | EXTENDED_RELEASE_TABLET | Freq: Once | ORAL | Status: AC
  Administered 2021-03-10: 60 meq via ORAL
  Filled 2021-03-10: qty 3

## 2021-03-10 MED ORDER — INSULIN GLARGINE-YFGN 100 UNIT/ML ~~LOC~~ SOLN
40.0000 [IU] | SUBCUTANEOUS | Status: AC
  Administered 2021-03-11: 40 [IU] via SUBCUTANEOUS
  Filled 2021-03-10: qty 0.4

## 2021-03-10 MED ORDER — INSULIN GLARGINE 100 UNIT/ML ~~LOC~~ SOLN: 40.0000 [IU] | Freq: Every day | SUBCUTANEOUS | 0 refills | Status: DC

## 2021-03-10 MED ORDER — INSULIN ASPART 100 UNIT/ML IJ SOLN
14.0000 [IU] | Freq: Once | INTRAMUSCULAR | Status: AC
  Administered 2021-03-10: 14 [IU] via INTRAVENOUS
  Filled 2021-03-10: qty 1

## 2021-03-10 MED ORDER — VICTOZA 18 MG/3ML ~~LOC~~ SOPN: 1.8000 mg | PEN_INJECTOR | Freq: Every morning | SUBCUTANEOUS | 0 refills | Status: DC

## 2021-03-10 MED ORDER — CEPHALEXIN 500 MG PO CAPS: 500.0000 mg | ORAL_CAPSULE | Freq: Three times a day (TID) | ORAL | 0 refills | Status: DC

## 2021-03-10 NOTE — ED Triage Notes (Signed)
Pt reports is a diabetic and is supposed be on insulin but admits to not taking it for the last 1-2 months.

## 2021-03-10 NOTE — ED Triage Notes (Signed)
Pt reports dizziness, weakness, headache and blurred vision since waking up.
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
## 2021-03-29 DIAGNOSIS — R300 Dysuria: Secondary | ICD-10-CM | POA: Insufficient documentation

## 2021-04-17 ENCOUNTER — Other Ambulatory Visit: Payer: Self-pay

## 2021-04-17 ENCOUNTER — Ambulatory Visit: Payer: Medicaid Other | Admitting: Advanced Practice Midwife

## 2021-04-17 ENCOUNTER — Encounter: Payer: Self-pay | Admitting: Advanced Practice Midwife

## 2021-04-17 DIAGNOSIS — Z113 Encounter for screening for infections with a predominantly sexual mode of transmission: Secondary | ICD-10-CM

## 2021-04-17 DIAGNOSIS — Z62810 Personal history of physical and sexual abuse in childhood: Secondary | ICD-10-CM

## 2021-04-17 DIAGNOSIS — Z720 Tobacco use: Secondary | ICD-10-CM | POA: Insufficient documentation

## 2021-04-17 LAB — WET PREP FOR TRICH, YEAST, CLUE
Trichomonas Exam: NEGATIVE
Yeast Exam: NEGATIVE

## 2021-04-17 LAB — HM HIV SCREENING LAB: HM HIV Screening: NEGATIVE

## 2021-04-17 NOTE — Progress Notes (Signed)
Pt here for STD screening.  Wet mount results reviewed, no treatment required per SO.  Pt declined condoms.  Anyelina Claycomb M Shayden Bobier, RN ° °
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
## 2021-04-21 LAB — GONOCOCCUS CULTURE

## 2021-04-30 ENCOUNTER — Telehealth: Payer: Self-pay | Admitting: Family Medicine

## 2021-04-30 NOTE — Telephone Encounter (Signed)
Patient is wondering if her test results are back yet or not.

## 2021-05-03 DIAGNOSIS — N898 Other specified noninflammatory disorders of vagina: Secondary | ICD-10-CM | POA: Insufficient documentation

## 2021-05-31 DIAGNOSIS — Z0000 Encounter for general adult medical examination without abnormal findings: Secondary | ICD-10-CM | POA: Insufficient documentation

## 2021-05-31 DIAGNOSIS — Z113 Encounter for screening for infections with a predominantly sexual mode of transmission: Secondary | ICD-10-CM | POA: Insufficient documentation

## 2021-06-19 ENCOUNTER — Other Ambulatory Visit: Payer: Self-pay

## 2021-06-19 ENCOUNTER — Emergency Department
Admission: EM | Admit: 2021-06-19 | Discharge: 2021-06-20 | Disposition: A | Discharge status: Home / Self Care | Payer: Medicaid Other | Attending: Emergency Medicine | Admitting: Emergency Medicine

## 2021-06-19 DIAGNOSIS — N9489 Other specified conditions associated with female genital organs and menstrual cycle: Secondary | ICD-10-CM | POA: Insufficient documentation

## 2021-06-19 DIAGNOSIS — E119 Type 2 diabetes mellitus without complications: Secondary | ICD-10-CM | POA: Insufficient documentation

## 2021-06-19 DIAGNOSIS — F17200 Nicotine dependence, unspecified, uncomplicated: Secondary | ICD-10-CM | POA: Diagnosis not present

## 2021-06-19 DIAGNOSIS — N939 Abnormal uterine and vaginal bleeding, unspecified: Secondary | ICD-10-CM | POA: Insufficient documentation

## 2021-06-19 DIAGNOSIS — R1030 Lower abdominal pain, unspecified: Secondary | ICD-10-CM | POA: Diagnosis not present

## 2021-06-19 LAB — URINALYSIS, ROUTINE W REFLEX MICROSCOPIC
Bacteria, UA: NONE SEEN
Bilirubin Urine: NEGATIVE
Glucose, UA: >=500 mg/dL — AB
Ketones, ur: NEGATIVE mg/dL
Leukocytes,Ua: NEGATIVE
Nitrite: NEGATIVE
Protein, ur: NEGATIVE mg/dL
Specific Gravity, Urine: 1.031 — ABNORMAL HIGH (ref 1.005–1.030)
pH: 6 (ref 5.0–8.0)

## 2021-06-19 LAB — CBC WITH DIFFERENTIAL/PLATELET
Abs Immature Granulocytes: 0.04 10*3/uL (ref 0.00–0.07)
Basophils Absolute: 0 10*3/uL (ref 0.0–0.1)
Basophils Relative: 0 %
Eosinophils Absolute: 0 10*3/uL (ref 0.0–0.5)
Eosinophils Relative: 0 %
HCT: 40 % (ref 36.0–46.0)
Hemoglobin: 12.9 g/dL (ref 12.0–15.0)
Immature Granulocytes: 0 %
Lymphocytes Relative: 24 %
Lymphs Abs: 2.3 10*3/uL (ref 0.7–4.0)
MCH: 28.5 pg (ref 26.0–34.0)
MCHC: 32.3 g/dL (ref 30.0–36.0)
MCV: 88.3 fL (ref 80.0–100.0)
Monocytes Absolute: 0.5 10*3/uL (ref 0.1–1.0)
Monocytes Relative: 5 %
Neutro Abs: 6.9 10*3/uL (ref 1.7–7.7)
Neutrophils Relative %: 71 %
Platelets: 246 10*3/uL (ref 150–400)
RBC: 4.53 MIL/uL (ref 3.87–5.11)
RDW: 13 % (ref 11.5–15.5)
WBC: 9.9 10*3/uL (ref 4.0–10.5)
nRBC: 0 % (ref 0.0–0.2)

## 2021-06-19 LAB — BASIC METABOLIC PANEL
Anion gap: 3 — ABNORMAL LOW (ref 5–15)
BUN: 11 mg/dL (ref 6–20)
CO2: 32 mmol/L (ref 22–32)
Calcium: 8.6 mg/dL — ABNORMAL LOW (ref 8.9–10.3)
Chloride: 98 mmol/L (ref 98–111)
Creatinine, Ser: 0.58 mg/dL (ref 0.44–1.00)
GFR, Estimated: >60 mL/min (ref >60)
Glucose, Bld: 309 mg/dL — ABNORMAL HIGH (ref 70–99)
Potassium: 3.5 mmol/L (ref 3.5–5.1)
Sodium: 133 mmol/L — ABNORMAL LOW (ref 135–145)

## 2021-06-19 LAB — SAMPLE TO BLOOD BANK

## 2021-06-19 LAB — HCG, QUANTITATIVE, PREGNANCY: hCG, Beta Chain, Quant, S: <1 m[IU]/mL (ref <5)

## 2021-06-19 NOTE — ED Triage Notes (Signed)
Pt presents to ER from home c/o vaginal bleeding x8 days that has been heavy at times.  Pt states she has been passing blood clots around golf ball size.  Pt states her period "started" on 3/7 and bleeding has been going on since then.  Pt A&O x4 at this time in NAD.  Pt c/o lower back and abdomen pain at this time.   ?

## 2021-06-20 ENCOUNTER — Emergency Department: Payer: Medicaid Other

## 2021-06-20 LAB — POC URINE PREG, ED: Preg Test, Ur: NEGATIVE

## 2021-06-20 NOTE — ED Notes (Signed)
Pt taken to US

## 2021-06-20 NOTE — ED Notes (Signed)
Pt verbalized understanding of discharge.

## 2021-06-20 NOTE — ED Provider Notes (Signed)
? ?[facility] ?Provider Note ? ? ? Event Date/Time  ? First MD Initiated Contact with Patient 06/19/21 2316   ?  (approximate) ? ? ?History  ? ?Vaginal Bleeding ? ? ?HPI ? ?Beth Clarke is a 41 y.o. female with a history of diabetes who presents for evaluation of vaginal bleeding.  Patient reports that her period started a week ago.  She has been having very heavy bleeding for 7 days and passing large clots.  She is also complaining of diffuse cramping lower abdominal pain that feels like her menstrual cramps but a little bit worse.  No history of abnormal uterine bleeding.  No missed period.  She is not on any hormones. ?  ? ? ?Past Medical History:  ?Diagnosis Date  ? Diabetes mellitus without complication (HCC)   ? ? ?Past Surgical History:  ?Procedure Laterality Date  ? CESAREAN SECTION    ? ECTOPIC PREGNANCY SURGERY    ? ? ? ?Physical Exam  ? ?Triage Vital Signs: ?ED Triage Vitals  ?Enc Vitals Group  ?   BP 06/19/21 2302 124/81  ?   Pulse Rate 06/19/21 2302 91  ?   Resp 06/19/21 2302 18  ?   Temp 06/19/21 2302 98 ?F (36.7 ?C)  ?   Temp Source 06/19/21 2302 Oral  ?   SpO2 06/19/21 2302 99 %  ?   Weight 06/19/21 2303 125 lb (56.7 kg)  ?   Height 06/19/21 2303 5' (1.524 m)  ?   Head Circumference --   ?   Peak Flow --   ?   Pain Score 06/19/21 2302 6  ?   Pain Loc --   ?   Pain Edu? --   ?   Excl. in GC? --   ? ? ?Most recent vital signs: ?Vitals:  ? 06/19/21 2302  ?BP: 124/81  ?Pulse: 91  ?Resp: 18  ?Temp: 98 ?F (36.7 ?C)  ?SpO2: 99%  ? ? ? ?Constitutional: Alert and oriented. Well appearing and in no apparent distress. ?HEENT: ?     Head: Normocephalic and atraumatic.    ?     Eyes: Conjunctivae are normal. Sclera is non-icteric.  ?     Mouth/Throat: Mucous membranes are moist.  ?     Neck: Supple with no signs of meningismus. ?Cardiovascular: Regular rate and rhythm. No murmurs, gallops, or rubs. 2+ symmetrical distal pulses are present in all extremities.  ?Respiratory: Normal  respiratory effort. Lungs are clear to auscultation bilaterally.  ?Gastrointestinal: Soft, non tender, and non distended with positive bowel sounds. No rebound or guarding. ?Genitourinary: No CVA tenderness. ?Musculoskeletal:  No edema, cyanosis, or erythema of extremities. ?Neurologic: Normal speech and language. Face is symmetric. Moving all extremities. No gross focal neurologic deficits are appreciated. ?Skin: Skin is warm, dry and intact. No rash noted. ?Psychiatric: Mood and affect are normal. Speech and behavior are normal. ? ?ED Results / Procedures / Treatments  ? ?Labs ?(all labs ordered are listed, but only abnormal results are displayed) ?Labs Reviewed  ?BASIC METABOLIC PANEL - Abnormal; Notable for the following components:  ?    Result Value  ? Sodium 133 (*)   ? Glucose, Bld 309 (*)   ? Calcium 8.6 (*)   ? Anion gap 3 (*)   ? All other components within normal limits  ?URINALYSIS, ROUTINE W REFLEX MICROSCOPIC - Abnormal; Notable for the following components:  ? Color, Urine YELLOW (*)   ? APPearance CLEAR (*)   ?  Specific Gravity, Urine 1.031 (*)   ? Glucose, UA >=500 (*)   ? Hgb urine dipstick LARGE (*)   ? All other components within normal limits  ?POC URINE PREG, ED - Normal  ?CBC WITH DIFFERENTIAL/PLATELET  ?HCG, QUANTITATIVE, PREGNANCY  ?SAMPLE TO BLOOD BANK  ? ? ? ?EKG ? ?none ? ? ?RADIOLOGY ?I, Nita Sicklearolina Zilla Shartzer, attending MD, have personally viewed and interpreted the images obtained during this visit as below: ? ?Ultrasound showing no abnormalities ? ? ?___________________________________________________ ?Interpretation by Radiologist:  ?US PELVIC COMPLETE WITH TRANSVAGINAL ? ?Result Date: 06/20/2021 ?CLINICAL DATA:  Vaginal bleeding for 8 days. Passing large clots. Negative urine pregnancy test. EXAM: TRANSABDOMINAL AND TRANSVAGINAL ULTRASOUND OF PELVIS TECHNIQUE: Both transabdominal and transvaginal ultrasound examinations of the pelvis were performed. Transabdominal technique was performed  for global imaging of the pelvis including uterus, ovaries, adnexal regions, and pelvic cul-de-sac. It was necessary to proceed with endovaginal exam following the transabdominal exam to visualize the ovaries and endometrium. COMPARISON:  None FINDINGS: Uterus Measurements: 9.4 x 4.4 x 5.2 cm = volume: 112 mL. Uterus is anteverted. No focal myometrial lesions identified. Small nabothian cysts in the cervix. Endometrium Thickness: 11 mm.  No focal abnormality visualized. Right ovary Measurements: 3 x 1.9 x 2.9 cm = volume: 9 mL. Normal appearance/no adnexal mass. Left ovary Measurements: 3.6 x 1.7 x 2.7 cm = volume: 9 mL. Normal appearance/no adnexal mass. Other findings Minimal free fluid in the pelvis. IMPRESSION: Normal ultrasound appearance of the uterus and ovaries. Electronically Signed   By: Burman NievesWilliam  Stevens M.D.   On: 06/20/2021 01:38   ? ? ? ?PROCEDURES: ? ?Critical Care performed: No ? ?Procedures ? ? ? ?IMPRESSION / MDM / ASSESSMENT AND PLAN / ED COURSE  ?I reviewed the triage vital signs and the nursing notes. ? ?41 y.o. female with a history of diabetes who presents for evaluation of heavy vaginal bleeding x 7 days.  Patient is well-appearing, hemodynamically stable.  Abdomen soft and nontender ? ?Ddx: Abnormal uterine bleeding versus miscarriage versus ectopic pregnancy versus uterine fibroid versus STD versus endometrial cancer  ? ? ?Plan: We will check CBC, hCG, BMP, do pelvic exam and get a transvaginal ultrasound.  Patient placed on telemetry for monitoring of any hemodynamic instability ? ? ?MEDICATIONS GIVEN IN ED: ?Medications - No data to display ? ? ?ED COURSE: Patient remained hemodynamically stable with no signs of hypotension or tachycardia.  Hemoglobin is normal at 12.9.  hCG is undetectable.  Pelvic ultrasound showing no acute abnormalities.  Recommended a pelvic exam to rule out STD but patient reports that she was recently tested for STD and does not wish to undergo testing at this time.   She does have an OB/GYN.  Recommended close follow-up with OB for further evaluation.  No indication for transfusion at this time.  Admission was considered but since this patient is hemodynamically stable with a normal hemoglobin I felt that it was unnecessary.  We did discuss starting OCPs to stop the bleeding but patient is a diabetic and smokes therefore I feel that the risk of blood clot on his high especially since patient is nowhere near needing a blood transfusion.  We discussed signs and symptoms of acute blood loss anemia recommended return if these develop. ? ? ?Consults: None ? ? ?EMR reviewed including last visit with her primary care doctor from 3 weeks ago where patient was seen for an annual exam and had a Pap smear ? ? ? ?FINAL CLINICAL IMPRESSION(S) /  ED DIAGNOSES  ? ?Final diagnoses:  ?Vaginal bleeding  ?Abnormal uterine bleeding  ? ? ? ?Rx / DC Orders  ? ?ED Discharge Orders   ? ? None  ? ?  ? ? ? ?Note:  This document was prepared using Dragon voice recognition software and may include unintentional dictation errors. ? ? ?Please note:  Patient was evaluated in Emergency Department today for the symptoms described in the history of present illness. Patient was evaluated in the context of the global COVID-19 pandemic, which necessitated consideration that the patient might be at risk for infection with the SARS-CoV-2 virus that causes COVID-19. Institutional protocols and algorithms that pertain to the evaluation of patients at risk for COVID-19 are in a state of rapid change based on information released by regulatory bodies including the CDC and federal and state organizations. These policies and algorithms were followed during the patient's care in the ED.  Some ED evaluations and interventions may be delayed as a result of limited staffing during the pandemic. ? ? ? ? ?  ?Nita Sickle, MD ?06/20/21 0154 ? ?

## 2021-08-06 ENCOUNTER — Other Ambulatory Visit: Payer: Self-pay

## 2021-08-06 ENCOUNTER — Emergency Department: Payer: Medicaid Other

## 2021-08-06 ENCOUNTER — Encounter: Payer: Self-pay | Admitting: *Deleted

## 2021-08-06 ENCOUNTER — Emergency Department
Admission: EM | Admit: 2021-08-06 | Discharge: 2021-08-06 | Disposition: A | Discharge status: Home / Self Care | Payer: Medicaid Other | Attending: Emergency Medicine | Admitting: Emergency Medicine

## 2021-08-06 DIAGNOSIS — N939 Abnormal uterine and vaginal bleeding, unspecified: Secondary | ICD-10-CM | POA: Diagnosis not present

## 2021-08-06 DIAGNOSIS — N760 Acute vaginitis: Secondary | ICD-10-CM | POA: Diagnosis not present

## 2021-08-06 DIAGNOSIS — N83202 Unspecified ovarian cyst, left side: Secondary | ICD-10-CM | POA: Diagnosis not present

## 2021-08-06 DIAGNOSIS — R109 Unspecified abdominal pain: Secondary | ICD-10-CM | POA: Diagnosis present

## 2021-08-06 LAB — COMPREHENSIVE METABOLIC PANEL
ALT: 16 U/L (ref 0–44)
AST: 17 U/L (ref 15–41)
Albumin: 3.6 g/dL (ref 3.5–5.0)
Alkaline Phosphatase: 73 U/L (ref 38–126)
Anion gap: 7 (ref 5–15)
BUN: 13 mg/dL (ref 6–20)
CO2: 26 mmol/L (ref 22–32)
Calcium: 8.5 mg/dL — ABNORMAL LOW (ref 8.9–10.3)
Chloride: 101 mmol/L (ref 98–111)
Creatinine, Ser: 0.59 mg/dL (ref 0.44–1.00)
GFR, Estimated: >60 mL/min (ref >60)
Glucose, Bld: 330 mg/dL — ABNORMAL HIGH (ref 70–99)
Potassium: 3.7 mmol/L (ref 3.5–5.1)
Sodium: 134 mmol/L — ABNORMAL LOW (ref 135–145)
Total Bilirubin: 0.4 mg/dL (ref 0.3–1.2)
Total Protein: 7.1 g/dL (ref 6.5–8.1)

## 2021-08-06 LAB — WET PREP, GENITAL
Sperm: NONE SEEN
Trich, Wet Prep: NONE SEEN
WBC, Wet Prep HPF POC: <10 (ref <10)
Yeast Wet Prep HPF POC: NONE SEEN

## 2021-08-06 LAB — CBC
HCT: 39.2 % (ref 36.0–46.0)
Hemoglobin: 12.7 g/dL (ref 12.0–15.0)
MCH: 28.6 pg (ref 26.0–34.0)
MCHC: 32.4 g/dL (ref 30.0–36.0)
MCV: 88.3 fL (ref 80.0–100.0)
Platelets: 200 10*3/uL (ref 150–400)
RBC: 4.44 MIL/uL (ref 3.87–5.11)
RDW: 13.2 % (ref 11.5–15.5)
WBC: 10.4 10*3/uL (ref 4.0–10.5)
nRBC: 0 % (ref 0.0–0.2)

## 2021-08-06 LAB — POC URINE PREG, ED: Preg Test, Ur: NEGATIVE

## 2021-08-06 MED ORDER — METRONIDAZOLE 500 MG PO TABS: 500.0000 mg | ORAL_TABLET | Freq: Three times a day (TID) | ORAL | 0 refills | Status: AC

## 2021-08-06 NOTE — ED Provider Triage Note (Signed)
Emergency Medicine Provider Triage Evaluation Note ? ?Chelsea Primus , a 41 y.o. female  was evaluated in triage.  Pt complains of left lower quadrant pain, vaginal bleeding after IUD placement a few weeks ago.  Patient's provider said she was unsure if she had in the right place that she feels asleep.. ? ?Review of Systems  ?Positive: Left lower quadrant pain ?Negative:  ? ?Physical Exam  ?BP 111/82 (BP Location: Left Arm)   Pulse 88   Temp 98.8 ?F (37.1 ?C) (Oral)   Resp 18   Ht 5' (1.524 m)   Wt 58.1 kg   LMP 08/06/2021 (Exact Date)   SpO2 96%   BMI 25.00 kg/m?  ?Gen:   Awake, no distress   ?Resp:  Normal effort  ?MSK:   Moves extremities without difficulty  ?Other:  Abdomen tender left lower quadrant ? ?Medical Decision Making  ?Medically screening exam initiated at 5:00 PM.  Appropriate orders placed.  YAMILEX KRETSCHMER was informed that the remainder of the evaluation will be completed by another provider, this initial triage assessment does not replace that evaluation, and the importance of remaining in the ED until their evaluation is complete. ? ?Labs and ultrasound ordered ?  ?Versie Starks, PA-C ?08/06/21 1701 ? ?

## 2021-08-06 NOTE — ED Notes (Signed)
Poct pregnancy Negative 

## 2021-08-06 NOTE — ED Triage Notes (Signed)
Pt has vag bleeding for 2 months.  Pt has iud for 6 weeks.  Pt has low abd cramping.  No dysuria.  Pt alert  speech clear.   ?

## 2021-08-06 NOTE — Discharge Instructions (Signed)
Take Flagyl three times daily for the next seven days. ?

## 2021-08-06 NOTE — ED Provider Notes (Signed)
? ?[facility] ?Provider Note ? ?Patient Contact: 9:16 PM (approximate) ? ? ?History  ? ?Vaginal Bleeding ? ? ?HPI ? ?Beth Clarke is a 41 y.o. female presents to the emergency department with irregular vaginal bleeding over the past 2 months and some mild cramping along the left.  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  She denies vomiting or diarrhea.  No dysuria, hematuria or increased urinary frequency.  Patient states that she is on an oral contraceptive and has an IUD.  She has a follow-up appointment with gynecology on May 15. ? ?  ? ? ?Physical Exam  ? ?Triage Vital Signs: ?ED Triage Vitals  ?Enc Vitals Group  ?   BP 08/06/21 1654 111/82  ?   Pulse Rate 08/06/21 1654 88  ?   Resp 08/06/21 1654 18  ?   Temp 08/06/21 1654 98.8 ?F (37.1 ?C)  ?   Temp Source 08/06/21 1654 Oral  ?   SpO2 08/06/21 1654 96 %  ?   Weight 08/06/21 1651 128 lb (58.1 kg)  ?   Height 08/06/21 1651 5' (1.524 m)  ?   Head Circumference --   ?   Peak Flow --   ?   Pain Score 08/06/21 1651 4  ?   Pain Loc --   ?   Pain Edu? --   ?   Excl. in GC? --   ? ? ?Most recent vital signs: ?Vitals:  ? 08/06/21 1654 08/06/21 2039  ?BP: 111/82 114/80  ?Pulse: 88 80  ?Resp: 18 18  ?Temp: 98.8 ?F (37.1 ?C)   ?SpO2: 96% 96%  ? ? ? ?General: Alert and in no acute distress. ?Eyes:  PERRL. EOMI. ?Head: No acute traumatic findings ?ENT: ?     Nose: No congestion/rhinnorhea. ?     Mouth/Throat: Mucous membranes are moist.  ?Neck: No stridor. No cervical spine tenderness to palpation. ?Cardiovascular:  Good peripheral perfusion ?Respiratory: Normal respiratory effort without tachypnea or retractions. Lungs CTAB. Good air entry to the bases with no decreased or absent breath sounds. ?Gastrointestinal: Bowel sounds ?4 quadrants. Soft and nontender to palpation. No guarding or rigidity. No palpable masses. No distention. No CVA tenderness. ?Musculoskeletal: Full range of motion to all  extremities.  ?Neurologic:  No gross focal neurologic deficits are appreciated.  ?Skin:   No rash noted ?Other: ? ? ?ED Results / Procedures / Treatments  ? ?Labs ?(all labs ordered are listed, but only abnormal results are displayed) ?Labs Reviewed  ?WET PREP, GENITAL - Abnormal; Notable for the following components:  ?    Result Value  ? Clue Cells Wet Prep HPF POC PRESENT (*)   ? All other components within normal limits  ?COMPREHENSIVE METABOLIC PANEL - Abnormal; Notable for the following components:  ? Sodium 134 (*)   ? Glucose, Bld 330 (*)   ? Calcium 8.5 (*)   ? All other components within normal limits  ?CBC  ?POC URINE PREG, ED  ? ? ? ?RADIOLOGY ? ?I personally viewed and evaluated these images as part of my medical decision making, as well as reviewing the written report by the radiologist. ? ?ED Provider Interpretation: Patient has a new 4.5 cm left ovarian cyst new from prior imaging.  No evidence of torsion with color-flow.  I personally reviewed pelvic ultrasound and agree with radiologist interpretation. ? ?PROCEDURES: ? ?Critical Care performed: No ? ?Procedures ? ? ?MEDICATIONS ORDERED IN  ED: ?Medications - No data to display ? ? ?IMPRESSION / MDM / ASSESSMENT AND PLAN / ED COURSE  ?I reviewed the triage vital signs and the nursing notes. ?             ?               ? ?Differential diagnosis includes, but is not limited to, ovarian cyst, adenomyosis, ovarian torsion.. ? ?Assessment and plan:  ?Pelvic pain ?Vaginal bleeding ?Bacterial vaginosis ?41 year old female presents to the urgent care with irregular vaginal bleeding and cramping. ? ?Vital signs are reassuring at triage.  On physical exam, patient was alert, active and nontoxic-appearing. ? ?CBC with and CMP consistent with patient's baseline.  Urine pregnancy test was negative.  Wet prep positive for clue cells.  Patient has a follow-up appointment with her gynecologist on May 19.  Recommended keeping appointment and serial pelvic  ultrasounds to assess for ovarian cyst resolution.  Also treated patient with Flagyl 3 times daily for the next 7 days for BV. ?  ? ? ?FINAL CLINICAL IMPRESSION(S) / ED DIAGNOSES  ? ?Final diagnoses:  ?Vaginal bleeding  ? ? ? ?Rx / DC Orders  ? ?ED Discharge Orders   ? ?      Ordered  ?  metroNIDAZOLE (FLAGYL) 500 MG tablet  3 times daily       ? 08/06/21 2208  ? ?  ?  ? ?  ? ? ? ?Note:  This document was prepared using Dragon voice recognition software and may include unintentional dictation errors. ?  ?Orvil Feil, PA-C ?08/06/21 2235 ? ?  ?Gilles Chiquito, MD ?08/06/21 2304 ? ?

## 2021-10-05 ENCOUNTER — Encounter: Payer: Self-pay | Admitting: Physician Assistant

## 2021-10-05 ENCOUNTER — Ambulatory Visit (LOCAL_COMMUNITY_HEALTH_CENTER): Payer: Medicaid Other | Admitting: Physician Assistant

## 2021-10-05 VITALS — BP 112/77 | Ht 60.0 in | Wt 125.6 lb

## 2021-10-05 DIAGNOSIS — Z3200 Encounter for pregnancy test, result unknown: Secondary | ICD-10-CM

## 2021-10-05 DIAGNOSIS — Z3202 Encounter for pregnancy test, result negative: Secondary | ICD-10-CM | POA: Diagnosis not present

## 2021-10-05 DIAGNOSIS — Z3009 Encounter for other general counseling and advice on contraception: Secondary | ICD-10-CM | POA: Diagnosis not present

## 2021-10-05 LAB — PREGNANCY, URINE: Preg Test, Ur: NEGATIVE

## 2021-10-05 NOTE — Progress Notes (Signed)
PT negative. Patient informed that clinic running behind and she may have to wait a while longer for provider. Patient states she needs to be somewhere soon and can't wait today. Would like to reschedule. Patient sent to clerical to reschedule.Marland KitchenMarland KitchenBurt Knack, RN

## 2021-10-05 NOTE — Progress Notes (Signed)
Patient here for [facility]. Had PE at [facility] on 05/31/2021. Last Pap NILM 05/31/2021. Would like discuss birth control today. Patient sent for PT per provider verbal orders.Burt Knack, RN

## 2021-10-05 NOTE — Progress Notes (Signed)
Patient not seen by provider. Refer to nursing notes.

## 2021-10-30 ENCOUNTER — Encounter: Payer: Self-pay | Admitting: Advanced Practice Midwife

## 2021-10-30 ENCOUNTER — Ambulatory Visit (LOCAL_COMMUNITY_HEALTH_CENTER): Payer: Medicaid Other | Admitting: Advanced Practice Midwife

## 2021-10-30 VITALS — BP 115/78 | Ht 60.0 in | Wt 124.4 lb

## 2021-10-30 DIAGNOSIS — Z3009 Encounter for other general counseling and advice on contraception: Secondary | ICD-10-CM

## 2021-10-30 DIAGNOSIS — Z30011 Encounter for initial prescription of contraceptive pills: Secondary | ICD-10-CM

## 2021-10-30 DIAGNOSIS — Z309 Encounter for contraceptive management, unspecified: Secondary | ICD-10-CM | POA: Diagnosis not present

## 2021-10-30 MED ORDER — NORETHINDRONE 0.35 MG PO TABS: 1.0000 | ORAL_TABLET | Freq: Every day | ORAL | 13 refills | Status: DC

## 2021-10-30 NOTE — Progress Notes (Signed)
Pt here for [facility].  FP packet given.  Berdie Ogren, RN

## 2021-10-30 NOTE — Progress Notes (Signed)
[facility] [address] Main Number: [phone]  Contraception/Family Planning VISIT ENCOUNTER NOTE  Subjective:   Beth Clarke is a 41 y.o. SWF smoker [id number] (20, 29, 45, 17, 15) female here for reproductive life counseling. The patient is currently using Female Condom to prevent pregnancy.  Pt here wanting ocp's. Last physical 05/31/21 [facility]. Last pap 05/31/21 neg no HPV done. Last sex 10/08/21 without condom; with current partner x 9 mo. LMP 10/10/21. Smoking 1/2 ppd. Last vaped yesterday. Last cigar 09/2021. Last ETOH 2022. Last dental exam 2 years ago. IUD Mirena inserted 06/29/21 and removed 09/2021. Went to urgent care 10/26/21 dx'd with BV and yeast and treated. DM dx'd 7 years ago and on insulin BID.  Working 32 hrs/wk and living with her kids  The patient does not want a pregnancy in the next year.    Client states they are looking for the following:  High efficacy at preventing pregnancy  Denies abnormal vaginal bleeding, discharge, pelvic pain, problems with intercourse or other gynecologic concerns.    Gynecologic History Patient's last menstrual period was 10/10/2021.  Health Maintenance Due  Topic Date Due   HEMOGLOBIN A1C  Never done   COVID-19 Vaccine (1) Never done   FOOT EXAM  Never done   OPHTHALMOLOGY EXAM  Never done   URINE MICROALBUMIN  Never done   Hepatitis C Screening  Never done   PAP SMEAR-Modifier  Never done     The following portions of the patient's history were reviewed and updated as appropriate: allergies, current medications, past family history, past medical history, past social history, past surgical history and problem list.  Review of Systems Pertinent items are noted in HPI.   Objective:  BP 115/78 (BP Location: Left Arm, Patient Position: Sitting)   Ht 5' (1.524 m)   Wt 124 lb 6.4 oz (56.4 kg)   LMP 10/10/2021   BMI 24.30 kg/m  Gen: well appearing, NAD HEENT: no  scleral icterus CV: RR Lung: Normal WOB Ext: warm well perfused    Assessment and Plan:   Need for emergency contraceptive care was assessed today.  Last unprotected sex was:  10/08/21 without condom; with current partner x 9 mo.   Patient reported > 120 hours .  Reviewed options and patient desired No method of ECP, declined all    Contraception counseling: Reviewed methods in a patient centered fashion and used shared decision making with the patient. Utilized [employer] patient education tools as appropriate. The patient stated there goals and desires from a method are: High efficacy at preventing pregnancy  We reviewed the following methods in detail based on patient preferences available included: Oral Contraceptive  Patient expressed they would like Oral Contraceptive  This was provided to the patient today.  if not why not clearly documented  Risks, benefits, and typical effectiveness rates were reviewed.  Questions were answered.  Written information was also given to the patient to review.       will follow up in  prn  for surveillance.   was told to call with any further questions, or with any concerns about this method of contraception or cycle control.  Emphasized use of condoms 100% of the time for STI prevention.   1. Family planning Encouraged dental exam   2. Encounter for initial prescription of contraceptive pills E-rx Micronor #13 I po daily to begin today Pt counseled abstinance next 7 days     Please  refer to After Visit Summary for other counseling recommendations.   No follow-ups on file.  Alberteen Spindle, CNM [facility] DEPARTMENT

## 2021-12-17 ENCOUNTER — Encounter: Payer: Self-pay | Admitting: Emergency Medicine

## 2021-12-17 ENCOUNTER — Ambulatory Visit
Admission: EM | Admit: 2021-12-17 | Discharge: 2021-12-17 | Disposition: A | Discharge status: Home / Self Care | Payer: Medicaid Other | Attending: Urgent Care | Admitting: Urgent Care

## 2021-12-17 DIAGNOSIS — R300 Dysuria: Secondary | ICD-10-CM | POA: Diagnosis present

## 2021-12-17 DIAGNOSIS — N898 Other specified noninflammatory disorders of vagina: Secondary | ICD-10-CM | POA: Diagnosis present

## 2021-12-17 LAB — POCT URINALYSIS DIP (MANUAL ENTRY)
Bilirubin, UA: NEGATIVE
Glucose, UA: 500 mg/dL — AB
Leukocytes, UA: NEGATIVE
Nitrite, UA: NEGATIVE
Protein Ur, POC: NEGATIVE mg/dL
Spec Grav, UA: 1.01 (ref 1.010–1.025)
Urobilinogen, UA: 0.2 E.U./dL
pH, UA: 7 (ref 5.0–8.0)

## 2021-12-17 LAB — POCT URINE PREGNANCY: Preg Test, Ur: NEGATIVE

## 2021-12-17 MED ORDER — FLUCONAZOLE 150 MG PO TABS: 150.0000 mg | ORAL_TABLET | Freq: Every day | ORAL | 0 refills | Status: AC

## 2021-12-17 MED ORDER — FLUCONAZOLE 150 MG PO TABS: 150.0000 mg | ORAL_TABLET | Freq: Every day | ORAL | 0 refills | Status: DC
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
## 2021-12-17 NOTE — ED Triage Notes (Signed)
Patient c/o vaginal discharge x 1 day.   Patient denies ABD pain or N/VD.   Patient endorses burning after urination.   Patient  endorses " white clumpy discharge".   Patient endorses abnormal odor to discharge.   Patient used an OTC yeast medication with no relief of symptoms.

## 2021-12-17 NOTE — Discharge Instructions (Addendum)
You were tested both for UTI and vaginal infection.  We will send a urine culture to confirm the negative urine analysis.  Take the first dose of Diflucan tonight.  Wait for the results of your vaginal swab.  If positive someone will contact you to prescribe an appropriate medication.  If given an antibiotic and your yeast infection returns, take the second dose of Diflucan.  Follow-up with your primary care provider regarding your glucose control.  Or as needed.

## 2021-12-19 ENCOUNTER — Telehealth: Payer: Self-pay

## 2021-12-19 ENCOUNTER — Telehealth (HOSPITAL_COMMUNITY): Payer: Self-pay | Admitting: Emergency Medicine

## 2021-12-19 ENCOUNTER — Other Ambulatory Visit (HOSPITAL_COMMUNITY): Admission: RE | Admit: 2021-12-19 | Payer: Medicaid Other | Source: Ambulatory Visit

## 2021-12-19 LAB — CERVICOVAGINAL ANCILLARY ONLY
Comment: NEGATIVE
Comment: NEGATIVE
Comment: NEGATIVE
Comment: NEGATIVE
Comment: NEGATIVE
Comment: NORMAL

## 2021-12-19 LAB — URINE CULTURE: Culture: NO GROWTH

## 2021-12-19 NOTE — Telephone Encounter (Signed)
Patient returned call and is aware she will need to return for recollect of cyto swab.   She states she will return today

## 2021-12-20 LAB — CERVICOVAGINAL ANCILLARY ONLY
Bacterial Vaginitis (gardnerella): POSITIVE — AB
Candida Glabrata: NEGATIVE
Candida Vaginitis: NEGATIVE
Chlamydia: NEGATIVE
Comment: NEGATIVE
Comment: NEGATIVE
Comment: NEGATIVE
Comment: NEGATIVE
Comment: NEGATIVE
Comment: NORMAL
Neisseria Gonorrhea: NEGATIVE
Trichomonas: NEGATIVE

## 2021-12-21 ENCOUNTER — Telehealth: Payer: Self-pay | Admitting: Urgent Care

## 2021-12-21 DIAGNOSIS — B9689 Other specified bacterial agents as the cause of diseases classified elsewhere: Secondary | ICD-10-CM

## 2021-12-21 MED ORDER — METRONIDAZOLE 500 MG PO TABS: 500.0000 mg | ORAL_TABLET | Freq: Two times a day (BID) | ORAL | 0 refills | Status: AC

## 2021-12-21 NOTE — Telephone Encounter (Signed)
Inform patient of positive result.  Prescription sent to her pharmacy.

## 2021-12-21 NOTE — Progress Notes (Signed)
Positive result for BV.  Prescription sent to her pharmacy.  Patient notified.

## 2022-01-05 ENCOUNTER — Ambulatory Visit
Admission: EM | Admit: 2022-01-05 | Discharge: 2022-01-05 | Disposition: A | Discharge status: Home / Self Care | Payer: Medicaid Other | Attending: Urgent Care | Admitting: Urgent Care

## 2022-01-05 DIAGNOSIS — R399 Unspecified symptoms and signs involving the genitourinary system: Secondary | ICD-10-CM | POA: Diagnosis not present

## 2022-01-05 DIAGNOSIS — N3001 Acute cystitis with hematuria: Secondary | ICD-10-CM

## 2022-01-05 LAB — POCT URINALYSIS DIP (MANUAL ENTRY)
Bilirubin, UA: NEGATIVE
Glucose, UA: NEGATIVE mg/dL
Ketones, POC UA: NEGATIVE mg/dL
Nitrite, UA: POSITIVE — AB
Protein Ur, POC: 30 mg/dL — AB
Spec Grav, UA: 1.025 (ref 1.010–1.025)
Urobilinogen, UA: 1 E.U./dL
pH, UA: 6 (ref 5.0–8.0)

## 2022-01-05 MED ORDER — FLUCONAZOLE 150 MG PO TABS: 150.0000 mg | ORAL_TABLET | Freq: Once | ORAL | 0 refills | Status: AC

## 2022-01-05 MED ORDER — NITROFURANTOIN MONOHYD MACRO 100 MG PO CAPS: 100.0000 mg | ORAL_CAPSULE | Freq: Two times a day (BID) | ORAL | 0 refills | Status: DC

## 2022-01-05 NOTE — Discharge Instructions (Addendum)
A sample of your urine was sent to the lab for testing to verify susceptibility to the antibiotic ordered.  Follow up here or with your primary care provider if your symptoms are worsening or not improving with treatment.

## 2022-01-05 NOTE — ED Triage Notes (Signed)
Pt. States for the past two days she had been experiencing dysuria and brown colored urine w/ a foul smell. Pt. Has been treating herself w/ cranberry pills.
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
## 2022-01-08 LAB — URINE CULTURE: Culture: >=100000 — AB

## 2022-01-17 ENCOUNTER — Emergency Department
Admission: EM | Admit: 2022-01-17 | Discharge: 2022-01-18 | Discharge status: Against Medical Advice | Payer: Medicaid Other | Attending: Emergency Medicine | Admitting: Emergency Medicine

## 2022-01-17 ENCOUNTER — Other Ambulatory Visit: Payer: Self-pay

## 2022-01-17 DIAGNOSIS — Z5321 Procedure and treatment not carried out due to patient leaving prior to being seen by health care provider: Secondary | ICD-10-CM | POA: Diagnosis not present

## 2022-01-17 DIAGNOSIS — R079 Chest pain, unspecified: Secondary | ICD-10-CM | POA: Diagnosis present

## 2022-01-17 DIAGNOSIS — R519 Headache, unspecified: Secondary | ICD-10-CM | POA: Insufficient documentation

## 2022-01-17 DIAGNOSIS — H538 Other visual disturbances: Secondary | ICD-10-CM | POA: Insufficient documentation

## 2022-01-17 LAB — CBG MONITORING, ED: Glucose-Capillary: 359 mg/dL — ABNORMAL HIGH (ref 70–99)

## 2022-01-17 NOTE — ED Triage Notes (Signed)
Reports started having sharp chest pains and now having headache a blurred vision.  Reports she gets headaches like this when her blood sugar is normal bc her body is used to it being high.  Denies SOB or pain radiating anywhere.  Denies n/v

## 2022-01-18 ENCOUNTER — Telehealth: Payer: Self-pay | Admitting: Emergency Medicine

## 2022-01-30 ENCOUNTER — Ambulatory Visit
Admission: RE | Admit: 2022-01-30 | Discharge: 2022-01-30 | Disposition: A | Discharge status: Home / Self Care | Payer: Medicaid Other | Source: Ambulatory Visit | Attending: Emergency Medicine | Admitting: Emergency Medicine

## 2022-01-30 VITALS — BP 116/79 | HR 100 | Temp 98.1°F | Resp 18 | Ht 60.0 in | Wt 123.0 lb

## 2022-01-30 DIAGNOSIS — Z113 Encounter for screening for infections with a predominantly sexual mode of transmission: Secondary | ICD-10-CM

## 2022-01-30 DIAGNOSIS — R300 Dysuria: Secondary | ICD-10-CM

## 2022-01-30 DIAGNOSIS — Z794 Long term (current) use of insulin: Secondary | ICD-10-CM | POA: Diagnosis present

## 2022-01-30 DIAGNOSIS — N898 Other specified noninflammatory disorders of vagina: Secondary | ICD-10-CM

## 2022-01-30 DIAGNOSIS — E1165 Type 2 diabetes mellitus with hyperglycemia: Secondary | ICD-10-CM | POA: Diagnosis present

## 2022-01-30 DIAGNOSIS — R1030 Lower abdominal pain, unspecified: Secondary | ICD-10-CM | POA: Diagnosis not present

## 2022-01-30 LAB — POCT URINALYSIS DIP (MANUAL ENTRY)
Bilirubin, UA: NEGATIVE
Blood, UA: NEGATIVE
Glucose, UA: >=1000 mg/dL — AB
Ketones, POC UA: NEGATIVE mg/dL
Leukocytes, UA: NEGATIVE
Nitrite, UA: NEGATIVE
Protein Ur, POC: NEGATIVE mg/dL
Spec Grav, UA: 1.02 (ref 1.010–1.025)
Urobilinogen, UA: 1 E.U./dL
pH, UA: 5.5 (ref 5.0–8.0)

## 2022-01-30 LAB — POCT FASTING CBG KUC MANUAL ENTRY: POCT Glucose (KUC): 316 mg/dL — AB (ref 70–99)

## 2022-01-30 LAB — POCT URINE PREGNANCY: Preg Test, Ur: NEGATIVE
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
## 2022-01-30 NOTE — Discharge Instructions (Addendum)
You blood sugar is high (316).  Your urine has sugar (>1000).  Follow up with your primary care provider today.    Go to the emergency department if you have abdominal pain or other concerning symptoms.  Your vaginal tests are pending.  If your test results are positive, we will call you.  You and your sexual partner(s) may require treatment at that time.  Do not have sexual activity for at least 7 days.

## 2022-01-30 NOTE — ED Triage Notes (Addendum)
Patient to Urgent Care with complaints of vaginal discharge. Also reports dysuria and and pain in her abdomen. Symptoms started 2 days ago.   Patient reports being treated for a UTI 2 weeks ago and states that she believes the UTI never completely resolved. Compliant with prescribed abx.

## 2022-01-31 ENCOUNTER — Telehealth (HOSPITAL_COMMUNITY): Payer: Self-pay | Admitting: Emergency Medicine

## 2022-01-31 LAB — CERVICOVAGINAL ANCILLARY ONLY
Bacterial Vaginitis (gardnerella): POSITIVE — AB
Candida Glabrata: NEGATIVE
Candida Vaginitis: NEGATIVE
Chlamydia: NEGATIVE
Comment: NEGATIVE
Comment: NEGATIVE
Comment: NEGATIVE
Comment: NEGATIVE
Comment: NEGATIVE
Comment: NORMAL
Neisseria Gonorrhea: NEGATIVE
Trichomonas: NEGATIVE

## 2022-01-31 MED ORDER — METRONIDAZOLE 500 MG PO TABS: 500.0000 mg | ORAL_TABLET | Freq: Two times a day (BID) | ORAL | 0 refills | Status: DC

## 2022-01-31 MED ORDER — FLUCONAZOLE 150 MG PO TABS: 150.0000 mg | ORAL_TABLET | Freq: Once | ORAL | 0 refills | Status: AC

## 2022-02-10 ENCOUNTER — Emergency Department: Payer: Medicaid Other

## 2022-02-10 ENCOUNTER — Emergency Department
Admission: EM | Admit: 2022-02-10 | Discharge: 2022-02-10 | Disposition: A | Discharge status: Home / Self Care | Payer: Medicaid Other | Attending: Emergency Medicine | Admitting: Emergency Medicine

## 2022-02-10 ENCOUNTER — Other Ambulatory Visit: Payer: Self-pay

## 2022-02-10 DIAGNOSIS — M5412 Radiculopathy, cervical region: Secondary | ICD-10-CM | POA: Insufficient documentation

## 2022-02-10 DIAGNOSIS — R200 Anesthesia of skin: Secondary | ICD-10-CM | POA: Diagnosis present

## 2022-02-10 DIAGNOSIS — E1165 Type 2 diabetes mellitus with hyperglycemia: Secondary | ICD-10-CM | POA: Insufficient documentation

## 2022-02-10 DIAGNOSIS — R739 Hyperglycemia, unspecified: Secondary | ICD-10-CM

## 2022-02-10 LAB — CBC
HCT: 43.1 % (ref 36.0–46.0)
Hemoglobin: 14.5 g/dL (ref 12.0–15.0)
MCH: 29 pg (ref 26.0–34.0)
MCHC: 33.6 g/dL (ref 30.0–36.0)
MCV: 86.2 fL (ref 80.0–100.0)
Platelets: 223 10*3/uL (ref 150–400)
RBC: 5 MIL/uL (ref 3.87–5.11)
RDW: 12.7 % (ref 11.5–15.5)
WBC: 13.1 10*3/uL — ABNORMAL HIGH (ref 4.0–10.5)
nRBC: 0 % (ref 0.0–0.2)

## 2022-02-10 LAB — BASIC METABOLIC PANEL
Anion gap: 8 (ref 5–15)
BUN: 8 mg/dL (ref 6–20)
CO2: 23 mmol/L (ref 22–32)
Calcium: 8.9 mg/dL (ref 8.9–10.3)
Chloride: 104 mmol/L (ref 98–111)
Creatinine, Ser: 0.64 mg/dL (ref 0.44–1.00)
GFR, Estimated: >60 mL/min (ref >60)
Glucose, Bld: 345 mg/dL — ABNORMAL HIGH (ref 70–99)
Potassium: 3.8 mmol/L (ref 3.5–5.1)
Sodium: 135 mmol/L (ref 135–145)

## 2022-02-10 LAB — CBG MONITORING, ED
Glucose-Capillary: 338 mg/dL — ABNORMAL HIGH (ref 70–99)
Glucose-Capillary: 190 mg/dL — ABNORMAL HIGH (ref 70–99)

## 2022-02-10 LAB — TROPONIN I (HIGH SENSITIVITY): Troponin I (High Sensitivity): 3 ng/L (ref <18)

## 2022-02-10 MED ORDER — INSULIN ASPART 100 UNIT/ML IJ SOLN
5.0000 [IU] | Freq: Once | INTRAMUSCULAR | Status: AC
  Administered 2022-02-10: 5 [IU] via SUBCUTANEOUS
  Filled 2022-02-10: qty 1

## 2022-02-10 MED ORDER — IBUPROFEN 600 MG PO TABS
600.0000 mg | ORAL_TABLET | Freq: Once | ORAL | Status: AC
  Administered 2022-02-10: 600 mg via ORAL
  Filled 2022-02-10: qty 1

## 2022-02-10 MED ORDER — PREDNISONE 20 MG PO TABS
60.0000 mg | ORAL_TABLET | Freq: Once | ORAL | Status: AC
  Administered 2022-02-10: 60 mg via ORAL
  Filled 2022-02-10: qty 3

## 2022-02-10 MED ORDER — PREDNISONE 20 MG PO TABS: ORAL_TABLET | ORAL | 0 refills | Status: AC

## 2022-02-10 MED ORDER — IBUPROFEN 600 MG PO TABS: 600.0000 mg | ORAL_TABLET | Freq: Four times a day (QID) | ORAL | 0 refills | Status: DC | PRN

## 2022-02-10 NOTE — ED Notes (Addendum)
Rn to bedside to introduce self to pt. Pt is CAOx4 and in no acute distress. Pt advised she woke up at 0900 with numbess in her left arm. She thought she slept on it wrong and paid it no mind. She told her mom about it this afternoon and her mom told her she must go to the hospital to have it looked at.

## 2022-02-10 NOTE — ED Provider Triage Note (Signed)
Emergency Medicine Provider Triage Evaluation Note  Beth Clarke , a 41 y.o. female  was evaluated in triage.  Pt complains of numbness to the L UE E.  Patient reports onset of symptoms at 930 this morning when she awoke.  She presents to the ED with her mother for evaluation.  Patient with history of diabetes reports some progression of numbness to the left lateral neck. She denies syncope or vision loss.   Review of Systems  Positive: LUE paresthesias Negative: FCS, NVD  Physical Exam  BP 115/82   Pulse 91   Temp 98.5 F (36.9 C) (Oral)   Resp 16   Ht 5' (1.524 m)   Wt 56 kg   LMP 01/18/2022 (Exact Date)   SpO2 98%   BMI 24.11 kg/m  Gen:   Awake, no distress  NAD Resp:  Normal effort  MSK:   Moves extremities without difficulty. Normal composite fists.  RRR:  Normal cap refill  Medical Decision Making  Medically screening exam initiated at 4:25 PM.  Appropriate orders placed.  Beth Clarke was informed that the remainder of the evaluation will be completed by another provider, this initial triage assessment does not replace that evaluation, and the importance of remaining in the ED until their evaluation is complete.  Patient to the ED for evaluation LUE numbness with onset morning at about 930.  Patient reports persistent symptoms and now reports some referred numbness into the left lateral neck.   Melvenia Needles, PA-C 02/10/22 1634
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
## 2022-02-10 NOTE — Discharge Instructions (Addendum)
Take the steroid taper as prescribed and the ibuprofen up to every 6 hours if needed for the numbness (try to take it when you have some food in your stomach).  Return to the ER for new, worsening, or persistent severe numbness, any development of weakness or difficulty with grip, any numbness or weakness in new areas such as the left side of your face or your left leg, vision changes, difficulty speaking, or any other new or worsening symptoms that concern you.  Follow-up with Dr. Izora Ribas.
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
## 2022-02-26 ENCOUNTER — Ambulatory Visit: Payer: Self-pay | Admitting: Neurosurgery

## 2022-03-04 ENCOUNTER — Other Ambulatory Visit: Payer: Self-pay | Admitting: Emergency Medicine

## 2022-03-04 ENCOUNTER — Other Ambulatory Visit: Payer: Self-pay

## 2022-03-04 ENCOUNTER — Emergency Department
Admission: EM | Admit: 2022-03-04 | Discharge: 2022-03-04 | Disposition: A | Discharge status: Home / Self Care | Payer: Medicaid Other | Attending: Emergency Medicine | Admitting: Emergency Medicine

## 2022-03-04 ENCOUNTER — Ambulatory Visit
Admission: RE | Admit: 2022-03-04 | Discharge: 2022-03-04 | Disposition: A | Discharge status: Home / Self Care | Payer: Medicaid Other | Source: Ambulatory Visit | Attending: Emergency Medicine | Admitting: Emergency Medicine

## 2022-03-04 VITALS — BP 114/77 | HR 86 | Temp 98.1°F | Resp 18

## 2022-03-04 DIAGNOSIS — E1165 Type 2 diabetes mellitus with hyperglycemia: Secondary | ICD-10-CM | POA: Diagnosis not present

## 2022-03-04 DIAGNOSIS — B9689 Other specified bacterial agents as the cause of diseases classified elsewhere: Secondary | ICD-10-CM

## 2022-03-04 DIAGNOSIS — Z794 Long term (current) use of insulin: Secondary | ICD-10-CM

## 2022-03-04 DIAGNOSIS — N760 Acute vaginitis: Secondary | ICD-10-CM | POA: Diagnosis not present

## 2022-03-04 DIAGNOSIS — Z113 Encounter for screening for infections with a predominantly sexual mode of transmission: Secondary | ICD-10-CM

## 2022-03-04 DIAGNOSIS — H538 Other visual disturbances: Secondary | ICD-10-CM

## 2022-03-04 DIAGNOSIS — R739 Hyperglycemia, unspecified: Secondary | ICD-10-CM

## 2022-03-04 DIAGNOSIS — R1032 Left lower quadrant pain: Secondary | ICD-10-CM

## 2022-03-04 DIAGNOSIS — N898 Other specified noninflammatory disorders of vagina: Secondary | ICD-10-CM

## 2022-03-04 LAB — URINALYSIS, ROUTINE W REFLEX MICROSCOPIC
Bacteria, UA: NONE SEEN
Bilirubin Urine: NEGATIVE
Glucose, UA: >=500 mg/dL — AB
Hgb urine dipstick: NEGATIVE
Ketones, ur: NEGATIVE mg/dL
Leukocytes,Ua: NEGATIVE
Nitrite: NEGATIVE
Protein, ur: NEGATIVE mg/dL
Specific Gravity, Urine: 1.03 (ref 1.005–1.030)
pH: 5 (ref 5.0–8.0)

## 2022-03-04 LAB — BASIC METABOLIC PANEL
Anion gap: 8 (ref 5–15)
BUN: 9 mg/dL (ref 6–20)
CO2: 24 mmol/L (ref 22–32)
Calcium: 8.6 mg/dL — ABNORMAL LOW (ref 8.9–10.3)
Chloride: 102 mmol/L (ref 98–111)
Creatinine, Ser: 0.55 mg/dL (ref 0.44–1.00)
GFR, Estimated: >60 mL/min (ref >60)
Glucose, Bld: 375 mg/dL — ABNORMAL HIGH (ref 70–99)
Potassium: 3.9 mmol/L (ref 3.5–5.1)
Sodium: 134 mmol/L — ABNORMAL LOW (ref 135–145)

## 2022-03-04 LAB — CBC
HCT: 40.9 % (ref 36.0–46.0)
Hemoglobin: 14.3 g/dL (ref 12.0–15.0)
MCH: 30.5 pg (ref 26.0–34.0)
MCHC: 35 g/dL (ref 30.0–36.0)
MCV: 87.2 fL (ref 80.0–100.0)
Platelets: 234 10*3/uL (ref 150–400)
RBC: 4.69 MIL/uL (ref 3.87–5.11)
RDW: 13 % (ref 11.5–15.5)
WBC: 11.2 10*3/uL — ABNORMAL HIGH (ref 4.0–10.5)
nRBC: 0 % (ref 0.0–0.2)

## 2022-03-04 LAB — POCT URINALYSIS DIP (MANUAL ENTRY)
Bilirubin, UA: NEGATIVE
Blood, UA: NEGATIVE
Glucose, UA: >=1000 mg/dL — AB
Leukocytes, UA: NEGATIVE
Nitrite, UA: NEGATIVE
Protein Ur, POC: NEGATIVE mg/dL
Spec Grav, UA: 1.02 (ref 1.010–1.025)
Urobilinogen, UA: 0.2 E.U./dL
pH, UA: 5.5 (ref 5.0–8.0)

## 2022-03-04 LAB — WET PREP, GENITAL
Sperm: NONE SEEN
Trich, Wet Prep: NONE SEEN
WBC, Wet Prep HPF POC: <10 (ref <10)
Yeast Wet Prep HPF POC: NONE SEEN

## 2022-03-04 LAB — CHLAMYDIA/NGC RT PCR (ARMC ONLY)
Chlamydia Tr: NOT DETECTED
N gonorrhoeae: NOT DETECTED

## 2022-03-04 LAB — CBG MONITORING, ED: Glucose-Capillary: 386 mg/dL — ABNORMAL HIGH (ref 70–99)

## 2022-03-04 LAB — POCT URINE PREGNANCY: Preg Test, Ur: NEGATIVE

## 2022-03-04 LAB — POCT FASTING CBG KUC MANUAL ENTRY: POCT Glucose (KUC): 357 mg/dL — AB (ref 70–99)

## 2022-03-04 LAB — POC URINE PREG, ED: Preg Test, Ur: NEGATIVE

## 2022-03-04 MED ORDER — SODIUM CHLORIDE 0.9 % IV BOLUS
1000.0000 mL | Freq: Once | INTRAVENOUS | Status: AC
  Administered 2022-03-04: 1000 mL via INTRAVENOUS

## 2022-03-04 MED ORDER — METRONIDAZOLE 500 MG PO TABS: 500.0000 mg | ORAL_TABLET | Freq: Two times a day (BID) | ORAL | 0 refills | Status: AC

## 2022-03-04 NOTE — Discharge Instructions (Signed)
Your swab was positive for bacterial vaginosis.  Take the medication as prescribed.  Remember do not drink alcohol with this medication.  Please follow-up with your outpatient provider for further glucose control/management.  Please return for any new, worsening, or change in symptoms or other concerns.

## 2022-03-04 NOTE — ED Notes (Signed)
Patient is being discharged from the Urgent Care and sent to the Emergency Department via pov . Per Ermalinda Memos, patient is in need of higher level of care due to limited resources. Patient is aware and verbalizes understanding of plan of care.  Vitals:   03/04/22 1112 03/04/22 1130  BP:  114/77  Pulse: 86   Resp: 18   Temp: 98.1 F (36.7 C)   SpO2: 98%

## 2022-03-04 NOTE — ED Notes (Signed)
Glucose checked -386
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
## 2022-03-04 NOTE — ED Notes (Signed)
Spoke to patient about the need for cbg-patient did agree to cbg
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
## 2022-03-04 NOTE — ED Provider Triage Note (Signed)
Emergency Medicine Provider Triage Evaluation Note  Beth Clarke , a 41 y.o. female  was evaluated in triage.  Pt complains of hyperglycemia despite being compliant with her medications. She had gone to her PCP for concern for BV and when glucose was checked they sent her to the ER because it was over 300 and she is having some blurred vision. Patient states that the vision change is normal when her glucose is elevated, but when it's normal the vision is fine.  Physical Exam  BP 117/77 (BP Location: Left Arm)   Pulse 89   Temp 98.5 F (36.9 C) (Oral)   Resp 18   Ht 5' (1.524 m)   Wt 55.8 kg   LMP 02/21/2022   SpO2 100%   BMI 24.02 kg/m  Gen:   Awake, no distress   Resp:  Normal effort  MSK:   Moves extremities without difficulty  Other:    Medical Decision Making  Medically screening exam initiated at 12:59 PM.  Appropriate orders placed.  Beth Clarke was informed that the remainder of the evaluation will be completed by another provider, this initial triage assessment does not replace that evaluation, and the importance of remaining in the ED until their evaluation is complete.    Chinita Pester, FNP 03/04/22 1301

## 2022-03-04 NOTE — ED Triage Notes (Signed)
Patient is telling me she does not want a finger stick today, she has to go to work.  Reports vaginal discomfort and discharge that started one week ago.  Patient has not used over the counter medications

## 2022-03-04 NOTE — ED Triage Notes (Signed)
Patient sent here for high blood sugar from PCP due to blurry vision and glucose reading in the 300s and urine glucose >1000.  Patient initally went for BV but sent her here for her blood sugar.
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
## 2022-03-08 LAB — SPECIMEN STATUS REPORT

## 2022-03-08 LAB — NUSWAB VAGINITIS PLUS (VG+)
Atopobium vaginae: HIGH Score — AB
BVAB 2: HIGH Score — AB
Candida albicans, NAA: NEGATIVE
Candida glabrata, NAA: NEGATIVE
Chlamydia trachomatis, NAA: NEGATIVE
Megasphaera 1: HIGH Score — AB
Neisseria gonorrhoeae, NAA: NEGATIVE
Trich vag by NAA: NEGATIVE

## 2022-03-18 NOTE — Progress Notes (Deleted)
Referring Physician:  Dionne Bucy, MD [address] [place],  [place] [address]  Primary Physician:  [facility], [place]  History of Present Illness: 03/18/2022 Ms. Beth Clarke is here today with a chief complaint of ***    Neck pain? Numbness and tingling in the left arm and hand?  Duration: ***1 month Location: *** Quality: *** Severity: *** 10/10 Precipitating: aggravated by *** Modifying factors: made better by ***nothing Weakness: none Timing: ***constant Bowel/Bladder Dysfunction: none  Conservative measures:  Physical therapy: *** has not participated in? Multimodal medical therapy including regular antiinflammatories: *** ibuprofen, prednisone Injections: *** has not received any epidural steroid injections  Past Surgery: *** denies  Beth Clarke has ***no symptoms of cervical myelopathy.  The symptoms are causing a significant impact on the patient's life.   I have utilized the care everywhere function in epic to review the outside records available from external health systems.  Review of Systems:  A 10 point review of systems is negative, except for the pertinent positives and negatives detailed in the HPI.  Past Medical History: Past Medical History:  Diagnosis Date   Diabetes mellitus without complication (HCC)    Vaginal Pap smear, abnormal    HPV positive    Past Surgical History: Past Surgical History:  Procedure Laterality Date   CESAREAN SECTION     ECTOPIC PREGNANCY SURGERY      Allergies: Allergies as of 03/19/2022 - Review Complete 03/04/2022  Allergen Reaction Noted   Metformin  10/15/2018   Hydrocodone Rash 03/30/2014   Hydrocodone-acetaminophen Itching 09/03/2013    Medications: No outpatient medications have been marked as taking for the 03/19/22 encounter (Appointment) with Venetia Night, MD.    Social History: Social History   Tobacco Use   Smoking status: Every Day    Packs/day:  0.50    Years: 3.50    Total pack years: 1.75    Types: Cigarettes, E-cigarettes, Cigars    Passive exposure: Never   Smokeless tobacco: Never  Vaping Use   Vaping Use: Former   Substances: Nicotine, Flavoring  Substance Use Topics   Alcohol use: Not Currently    Alcohol/week: 1.0 standard drink of alcohol    Types: 1 Shots of liquor per week    Comment: last use 02/05/21 2x/yr   Drug use: Not Currently    Types: Marijuana    Comment: last use age 40    Family Medical History: Family History  Problem Relation Age of Onset   Diabetes Mother    Hypertension Mother    Diabetes Maternal Grandmother    Cancer Maternal Grandmother     Physical Examination: There were no vitals filed for this visit.  General: Patient is well developed, well nourished, calm, collected, and in no apparent distress. Attention to examination is appropriate.  Neck:   Supple.  Full range of motion.  Respiratory: Patient is breathing without any difficulty.   NEUROLOGICAL:     Awake, alert, oriented to person, place, and time.  Speech is clear and fluent. Fund of knowledge is appropriate.   Cranial Nerves: Pupils equal round and reactive to light.  Facial tone is symmetric.  Facial sensation is symmetric. Shoulder shrug is symmetric. Tongue protrusion is midline.  There is no pronator drift.  ROM of spine: full.    Strength: Side Biceps Triceps Deltoid Interossei Grip Wrist Ext. Wrist Flex.  R 5 5 5 5 5 5 5   L 5 5 5 5 5 5  5  Side Iliopsoas Quads Hamstring PF DF EHL  R 5 5 5 5 5 5   L 5 5 5 5 5 5    Reflexes are ***2+ and symmetric at the biceps, triceps, brachioradialis, patella and achilles.   Hoffman's is absent.   Bilateral upper and lower extremity sensation is intact to light touch.    No evidence of dysmetria noted.  Gait is normal.     Medical Decision Making  Imaging: ***  I have personally reviewed the images and agree with the above interpretation.  Assessment and  Plan: Beth Clarke is a pleasant 41 y.o. female with ***    Thank you for involving me in the care of this patient.      Chester K. Georgie Chard MD, [facility] Neurosurgery

## 2022-03-19 ENCOUNTER — Ambulatory Visit: Payer: Self-pay | Admitting: Neurosurgery

## 2022-04-24 ENCOUNTER — Ambulatory Visit
Admission: RE | Admit: 2022-04-24 | Discharge: 2022-04-24 | Disposition: A | Discharge status: Home / Self Care | Payer: Medicaid Other | Source: Ambulatory Visit | Attending: Emergency Medicine | Admitting: Emergency Medicine

## 2022-04-24 VITALS — BP 116/80 | HR 84 | Resp 16 | Ht 60.0 in | Wt 125.0 lb

## 2022-04-24 DIAGNOSIS — N760 Acute vaginitis: Secondary | ICD-10-CM

## 2022-04-24 DIAGNOSIS — B9689 Other specified bacterial agents as the cause of diseases classified elsewhere: Secondary | ICD-10-CM | POA: Diagnosis not present

## 2022-04-24 DIAGNOSIS — B3731 Acute candidiasis of vulva and vagina: Secondary | ICD-10-CM | POA: Diagnosis not present

## 2022-04-24 LAB — WET PREP, GENITAL
Sperm: NONE SEEN
Trich, Wet Prep: NONE SEEN
WBC, Wet Prep HPF POC: <10 — AB (ref <10)

## 2022-04-24 MED ORDER — FLUCONAZOLE 150 MG PO TABS: 150.0000 mg | ORAL_TABLET | Freq: Every day | ORAL | 0 refills | Status: AC

## 2022-04-24 MED ORDER — METRONIDAZOLE 500 MG PO TABS: 500.0000 mg | ORAL_TABLET | Freq: Two times a day (BID) | ORAL | 0 refills | Status: DC

## 2022-04-24 NOTE — Discharge Instructions (Addendum)
Take the Flagyl (metronidazole) 500 mg twice daily for treatment of your bacterial vaginosis.  Avoid alcohol while on the metronidazole as taken together will cause of vomiting.  Bacterial vaginosis is often caused by a imbalance of bacteria in your vaginal vault.  This is sometimes a result of using tampons or hormonal fluctuations during her menstrual cycle.  You if your symptoms are recurrent you can try using a boric acid suppository twice weekly to help maintain the acid-base balance in your vagina vault which could prevent further infection.  You can also try vaginal probiotics to help return normal bacterial balance.   Take one 150 mg Diflucan tablet now and repeat dosing in 7 days after you have finished your ditazole for treatment of your bacterial vaginosis.

## 2022-04-24 NOTE — ED Triage Notes (Signed)
Pt c/o abd pain & vaginal discharge x2 days. Denies any N/V/D, [id number].
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
## 2022-05-26 ENCOUNTER — Ambulatory Visit
Admission: EM | Admit: 2022-05-26 | Discharge: 2022-05-26 | Disposition: A | Discharge status: Home / Self Care | Payer: Medicaid Other | Attending: Physician Assistant | Admitting: Physician Assistant

## 2022-05-26 DIAGNOSIS — N898 Other specified noninflammatory disorders of vagina: Secondary | ICD-10-CM | POA: Diagnosis present

## 2022-05-26 DIAGNOSIS — N760 Acute vaginitis: Secondary | ICD-10-CM | POA: Insufficient documentation

## 2022-05-26 LAB — WET PREP, GENITAL
Sperm: NONE SEEN
Trich, Wet Prep: NONE SEEN
WBC, Wet Prep HPF POC: <10 (ref <10)

## 2022-05-26 MED ORDER — FLUCONAZOLE 150 MG PO TABS: ORAL_TABLET | ORAL | 0 refills | Status: DC

## 2022-05-26 MED ORDER — METRONIDAZOLE 500 MG PO TABS: 500.0000 mg | ORAL_TABLET | Freq: Two times a day (BID) | ORAL | 0 refills | Status: AC

## 2022-05-26 NOTE — ED Triage Notes (Signed)
Pt c/o possible BV.  Pt is having abdominal pain, odor, and discharge  Pt states that this happens after finishing her menstrual.   Pt has tried Boric acid, but after using one tablet her menstrual started and BV symptoms started.

## 2022-05-26 NOTE — Discharge Instructions (Signed)
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
## 2022-07-30 ENCOUNTER — Ambulatory Visit: Payer: Medicaid Other | Admitting: Obstetrics and Gynecology

## 2022-07-30 DIAGNOSIS — B977 Papillomavirus as the cause of diseases classified elsewhere: Secondary | ICD-10-CM

## 2022-07-30 DIAGNOSIS — Z7689 Persons encountering health services in other specified circumstances: Secondary | ICD-10-CM

## 2022-08-02 ENCOUNTER — Ambulatory Visit
Admission: EM | Admit: 2022-08-02 | Discharge: 2022-08-02 | Disposition: A | Discharge status: Home / Self Care | Payer: Medicaid Other | Attending: Urgent Care | Admitting: Urgent Care

## 2022-08-02 DIAGNOSIS — N898 Other specified noninflammatory disorders of vagina: Secondary | ICD-10-CM

## 2022-08-02 LAB — POCT URINALYSIS DIP (MANUAL ENTRY)
Bilirubin, UA: NEGATIVE
Blood, UA: NEGATIVE
Glucose, UA: 500 mg/dL — AB
Ketones, POC UA: NEGATIVE mg/dL
Leukocytes, UA: NEGATIVE
Nitrite, UA: NEGATIVE
Protein Ur, POC: NEGATIVE mg/dL
Spec Grav, UA: 1.01 (ref 1.010–1.025)
Urobilinogen, UA: 0.2 E.U./dL
pH, UA: 5.5 (ref 5.0–8.0)

## 2022-08-02 MED ORDER — METRONIDAZOLE 500 MG PO TABS: 500.0000 mg | ORAL_TABLET | Freq: Two times a day (BID) | ORAL | 0 refills | Status: AC
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
## 2022-08-02 NOTE — Discharge Instructions (Addendum)
Follow up here or with your primary care provider if your symptoms are worsening or not improving.     

## 2022-08-02 NOTE — ED Triage Notes (Signed)
Patient presents to [facility] for vaginal discharge. Hx of BV.

## 2022-08-05 LAB — CERVICOVAGINAL ANCILLARY ONLY
Bacterial Vaginitis (gardnerella): POSITIVE — AB
Candida Glabrata: NEGATIVE
Candida Vaginitis: NEGATIVE
Chlamydia: NEGATIVE
Comment: NEGATIVE
Comment: NEGATIVE
Comment: NEGATIVE
Comment: NEGATIVE
Comment: NEGATIVE
Comment: NORMAL
Neisseria Gonorrhea: NEGATIVE
Trichomonas: NEGATIVE

## 2022-09-13 ENCOUNTER — Ambulatory Visit
Admission: RE | Admit: 2022-09-13 | Discharge: 2022-09-13 | Disposition: A | Discharge status: Home / Self Care | Payer: Medicaid Other | Source: Ambulatory Visit | Attending: Physician Assistant | Admitting: Physician Assistant

## 2022-09-13 VITALS — BP 112/81 | HR 98 | Temp 98.4°F | Resp 14 | Ht 60.0 in | Wt 123.0 lb

## 2022-09-13 DIAGNOSIS — N760 Acute vaginitis: Secondary | ICD-10-CM | POA: Diagnosis present

## 2022-09-13 LAB — WET PREP, GENITAL
Sperm: NONE SEEN
Trich, Wet Prep: NONE SEEN
WBC, Wet Prep HPF POC: NONE SEEN — AB (ref <10)
Yeast Wet Prep HPF POC: NONE SEEN

## 2022-09-13 LAB — URINALYSIS, W/ REFLEX TO CULTURE (INFECTION SUSPECTED)
Bilirubin Urine: NEGATIVE
Glucose, UA: >1000 mg/dL — AB
Ketones, ur: NEGATIVE mg/dL
Leukocytes,Ua: NEGATIVE
Nitrite: NEGATIVE
Protein, ur: NEGATIVE mg/dL
Specific Gravity, Urine: 1.01 (ref 1.005–1.030)
pH: 5.5 (ref 5.0–8.0)

## 2022-09-13 MED ORDER — METRONIDAZOLE 500 MG PO TABS: 500.0000 mg | ORAL_TABLET | Freq: Two times a day (BID) | ORAL | 0 refills | Status: DC

## 2022-09-13 MED ORDER — FLUCONAZOLE 150 MG PO TABS: 150.0000 mg | ORAL_TABLET | Freq: Every day | ORAL | 0 refills | Status: DC

## 2022-09-13 NOTE — ED Triage Notes (Signed)
Patient c/o lower abdominal pain and vaginal discharge that started 4 days ago.  Patient wants STD testing.

## 2022-09-13 NOTE — Discharge Instructions (Addendum)
Your vaginal swab indicated clue cells.  I will send in Metronidazole 500 mg twice a day for 7 days.Please take as directed.  You have also testing for STD pending any positive results, you will be called with treatment recommendations. If your symptoms persist or get worse follow up with PCP or urgent care.  Diflucan refill per patient request. Refilled due to her DM.
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
## 2022-09-16 LAB — CERVICOVAGINAL ANCILLARY ONLY
Chlamydia: NEGATIVE
Comment: NEGATIVE
Comment: NORMAL
Neisseria Gonorrhea: NEGATIVE

## 2022-09-25 ENCOUNTER — Ambulatory Visit (INDEPENDENT_AMBULATORY_CARE_PROVIDER_SITE_OTHER): Payer: Medicaid Other | Admitting: Obstetrics and Gynecology

## 2022-09-25 ENCOUNTER — Other Ambulatory Visit (HOSPITAL_COMMUNITY)
Admission: RE | Admit: 2022-09-25 | Discharge: 2022-09-25 | Disposition: A | Discharge status: Home / Self Care | Payer: Medicaid Other | Source: Ambulatory Visit | Attending: Obstetrics and Gynecology | Admitting: Obstetrics and Gynecology

## 2022-09-25 ENCOUNTER — Encounter: Payer: Self-pay | Admitting: Obstetrics and Gynecology

## 2022-09-25 VITALS — BP 115/77 | HR 89 | Resp 16 | Ht 60.0 in | Wt 122.5 lb

## 2022-09-25 DIAGNOSIS — R8789 Other abnormal findings in specimens from female genital organs: Secondary | ICD-10-CM | POA: Diagnosis present

## 2022-09-25 DIAGNOSIS — N870 Mild cervical dysplasia: Secondary | ICD-10-CM

## 2022-09-25 DIAGNOSIS — B9689 Other specified bacterial agents as the cause of diseases classified elsewhere: Secondary | ICD-10-CM

## 2022-09-25 NOTE — Patient Instructions (Signed)
Colposcopy  Colposcopy is a procedure to examine the lowest part of the uterus (cervix) for abnormalities or signs of disease. This procedure is done using an instrument that makes objects appear larger and provides light (colposcope). During the procedure, your health care provider may remove a tissue sample to look at under a microscope (biopsy). A biopsy may be done if any unusual cells are seen during the colposcopy. You may have a colposcopy if you have: An abnormal Pap smear, also called a Pap test. This screening test is used to check for signs of cancer or infection of the vagina, cervix, and uterus. An HPV (human papillomavirus) test and get a positive result for a type of HPV that puts you at high risk of cancer. Certain conditions or symptoms, such as: A sore, or lesion, on your cervix. Genital warts on your vulva, vagina, or cervix. Pain during sex. Vaginal bleeding, especially after sex. A growth on your cervix (cervical polyp) that needs to be removed. Let your health care provider know about: Any allergies you have, including allergies to medicines, latex, or iodine. All medicines you are taking, including vitamins, herbs, eye drops, creams, and over-the-counter medicines. Any bleeding problems you have. Any surgeries you have had. Any medical conditions you have, such as pelvic inflammatory disease (PID) or an endometrial disorder. The pattern of your menstrual cycles and the form of birth control (contraception) you use, if any. Your medical history, including any cervical treatments and how well you tolerated the procedure (if you have ever fainted). Whether you are pregnant or may be pregnant. What are the risks? Generally, this is a safe procedure. However, problems may occur, including: Infection. Symptoms of infection may include fever, bad-smelling vaginal discharge, or pelvic pain. Vaginal bleeding. Allergic reactions to medicines. Damage to nearby structures or  organs. What happens before the procedure? Medicines Ask your health care provider about: Changing or stopping your regular medicines. This is especially important if you are taking diabetes medicines or blood thinners. Taking medicines such as aspirin and ibuprofen. These medicines can thin your blood. Do not take these medicines unless your health care provider tells you to take them. Your health care provider will likely tell you to avoid taking aspirin, or medicine that contains aspirin, for 7 days before the procedure. Taking over-the-counter medicines, vitamins, herbs, and supplements. General instructions Tell your health care provider if you have your menstrual period now or will have it at the time of your procedure. A colposcopy is not normally done during your menstrual period. If you use contraception, continue to use it before your procedure. For 24 hours before the procedure: Do not use douche products or tampons. Do not use medicines, creams, or suppositories in the vagina. Do not have sex or insert anything into your vagina. Ask your health care provider what steps will be taken to prevent infection. What happens during the procedure? You will lie down on your back, with your feet in foot rests (stirrups). An instrument called a speculum will be inserted into your vagina. This will be used so your health care provider can see your cervix and the inside of your vagina. A cotton swab will be used to place a small amount of a liquid (solution) on the areas to be examined. This solution makes it easier to see abnormal cells. You may feel a slight burning during this part. The colposcope will be used to scan the cervix with a bright white light. The colposcope will be held near   your vulva and will make your vulva, vagina, and cervix look bigger so they can be seen better. If a biopsy is needed: You may be given a medicine to numb the area (local anesthetic). Surgical tools will be  used to remove mucus and cells through your vagina. You may feel mild pain while the tissue sample is removed. Bleeding may occur. A solution may be used to stop the bleeding. The tissue removed will be sent to a lab to be looked at under a microscope. The procedure may vary among health care providers and hospitals. What happens after the procedure? You may have some cramping in your abdomen. This should go away after a few minutes. It is up to you to get the results of your procedure. Ask your health care provider, or the department that is doing the procedure, when your results will be ready. Summary Colposcopy is a procedure to examine the lowest part of the uterus (cervix), for signs of disease. A biopsy may be done as part of the procedure. You may have some cramping in your abdomen. This should go away after a few minutes. It is up to you to get the results of your procedure. Ask your health care provider, or the department that is doing the procedure, when your results will be ready. This information is not intended to replace advice given to you by your health care provider. Make sure you discuss any questions you have with your health care provider. Document Revised: 08/20/2020 Document Reviewed: 08/20/2020 Elsevier Patient Education  2024 [employer].

## 2022-09-25 NOTE — Progress Notes (Signed)
     GYNECOLOGY OFFICE COLPOSCOPY PROCEDURE NOTE  42 y.o. [id number] here for colposcopy for NILM, HR HPV+  pap smear on 06/21/2022, performed at [facility]. Patient reports that she also had an abnormal pap smear last year which was being followed (review of chart notes pap smear in 2023 as NILM, HR HPV+). Has never had a colposcopy. Discussed role for HPV in cervical dysplasia, need for surveillance.  Patient gave informed written consent, time out was performed.  Placed in lithotomy position. Cervix viewed with speculum and colposcope after application of acetic acid.   Colposcopy adequate? Yes  no mosaicism, no punctation, no abnormal vasculature, and acetowhite lesion(s) noted at 12 and 3 o'clock; corresponding biopsies obtained.  ECC specimen obtained. All specimens were labeled and sent to pathology.  Chaperone was present during entire procedure.  Patient was given post procedure instructions.  Will follow up pathology and manage accordingly; patient will be contacted with results and recommendations.  Routine preventative health maintenance measures emphasized.    Hildred Laser, MD [facility]

## 2022-09-26 ENCOUNTER — Encounter: Payer: Self-pay | Admitting: Obstetrics and Gynecology

## 2022-09-29 LAB — SURGICAL PATHOLOGY

## 2022-10-09 ENCOUNTER — Ambulatory Visit
Admission: RE | Admit: 2022-10-09 | Discharge: 2022-10-09 | Disposition: A | Discharge status: Home / Self Care | Payer: Medicaid Other | Source: Ambulatory Visit | Attending: Emergency Medicine | Admitting: Emergency Medicine

## 2022-10-09 VITALS — BP 107/73 | HR 86 | Temp 98.1°F | Resp 18

## 2022-10-09 DIAGNOSIS — E1165 Type 2 diabetes mellitus with hyperglycemia: Secondary | ICD-10-CM | POA: Diagnosis not present

## 2022-10-09 DIAGNOSIS — Z3202 Encounter for pregnancy test, result negative: Secondary | ICD-10-CM | POA: Insufficient documentation

## 2022-10-09 DIAGNOSIS — R739 Hyperglycemia, unspecified: Secondary | ICD-10-CM | POA: Diagnosis present

## 2022-10-09 DIAGNOSIS — Z794 Long term (current) use of insulin: Secondary | ICD-10-CM | POA: Diagnosis present

## 2022-10-09 DIAGNOSIS — R81 Glycosuria: Secondary | ICD-10-CM | POA: Insufficient documentation

## 2022-10-09 DIAGNOSIS — N898 Other specified noninflammatory disorders of vagina: Secondary | ICD-10-CM | POA: Diagnosis present

## 2022-10-09 LAB — POCT URINALYSIS DIP (MANUAL ENTRY)
Bilirubin, UA: NEGATIVE
Blood, UA: NEGATIVE
Glucose, UA: >=1000 mg/dL — AB
Ketones, POC UA: NEGATIVE mg/dL
Leukocytes, UA: NEGATIVE
Nitrite, UA: NEGATIVE
Protein Ur, POC: NEGATIVE mg/dL
Spec Grav, UA: 1.01 (ref 1.010–1.025)
Urobilinogen, UA: 0.2 E.U./dL
pH, UA: 5.5 (ref 5.0–8.0)

## 2022-10-09 LAB — POCT FASTING CBG KUC MANUAL ENTRY: POCT Glucose (KUC): 456 mg/dL — AB (ref 70–99)

## 2022-10-09 LAB — POCT URINE PREGNANCY: Preg Test, Ur: NEGATIVE
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
## 2022-10-09 NOTE — ED Triage Notes (Addendum)
Patient to Urgent Care with complaints of vaginal discharge that started in the middle of June. Dysuria.   Patient was prescribed clindamycin POV 6/16 by [facility] for BV. Does not feel like this helped her symptoms. Also diagnosed with e.coli in her urine culture which she is concerned might have not gone away. Treated with Macrobid 6/16.  Patient also w/ cold sores present to her lips that have been present for three weeks. Reports trying multiple otc meds w/ no relief.

## 2022-10-09 NOTE — ED Notes (Signed)
Patient is being discharged from the Urgent Care and sent to the Emergency Department via POV . Per Wendee Beavers NP, patient is in need of higher level of care due to hyperglycemia. Patient is aware and verbalizes understanding of plan of care.  Vitals:   10/09/22 1309  BP: 107/73  Pulse: 86  Resp: 18  Temp: 98.1 F (36.7 C)  SpO2: 98%    Cbg 456

## 2022-10-09 NOTE — Discharge Instructions (Addendum)
Go to the emergency department for evaluation of your blood sugar of 456 and >1000 sugar in your urine.    There is no urine infection at this time.  Urine pregnancy negative.    Your vaginal tests are pending.  If your test results are positive, we will call you.  Do not have sexual activity for at least 7 days.

## 2022-10-11 LAB — CERVICOVAGINAL ANCILLARY ONLY
Bacterial Vaginitis (gardnerella): POSITIVE — AB
Candida Glabrata: NEGATIVE
Candida Vaginitis: POSITIVE — AB
Chlamydia: NEGATIVE
Comment: NEGATIVE
Comment: NEGATIVE
Comment: NEGATIVE
Comment: NEGATIVE
Comment: NEGATIVE
Comment: NORMAL
Neisseria Gonorrhea: NEGATIVE
Trichomonas: NEGATIVE

## 2022-10-12 ENCOUNTER — Telehealth: Payer: Self-pay

## 2022-10-12 MED ORDER — METRONIDAZOLE 500 MG PO TABS: 500.0000 mg | ORAL_TABLET | Freq: Two times a day (BID) | ORAL | 0 refills | Status: AC

## 2022-10-12 MED ORDER — FLUCONAZOLE 150 MG PO TABS: 150.0000 mg | ORAL_TABLET | Freq: Once | ORAL | 0 refills | Status: AC

## 2022-10-12 MED ORDER — METRONIDAZOLE 0.75 % VA GEL: 1.0000 | Freq: Every day | VAGINAL | Status: AC

## 2022-10-12 NOTE — Telephone Encounter (Signed)
Per Helaine Chess, PA-C, pt will need tx with both oral and PV Flagyl, as well as Diflucan x3 doses.  Attempted to reach patient x1. LVM. Rx sent to pharmacy on file.

## 2022-11-02 ENCOUNTER — Ambulatory Visit
Admission: RE | Admit: 2022-11-02 | Discharge: 2022-11-02 | Disposition: A | Discharge status: Home / Self Care | Payer: Medicaid Other | Source: Ambulatory Visit | Attending: Family Medicine | Admitting: Family Medicine

## 2022-11-02 ENCOUNTER — Other Ambulatory Visit: Payer: Self-pay

## 2022-11-02 VITALS — BP 110/79 | HR 87 | Temp 98.4°F | Resp 14 | Ht 60.0 in | Wt 124.0 lb

## 2022-11-02 DIAGNOSIS — N3001 Acute cystitis with hematuria: Secondary | ICD-10-CM | POA: Diagnosis not present

## 2022-11-02 LAB — URINALYSIS, COMPLETE (UACMP) WITH MICROSCOPIC
Bilirubin Urine: NEGATIVE
Glucose, UA: NEGATIVE mg/dL
Ketones, ur: NEGATIVE mg/dL
Nitrite: NEGATIVE
Protein, ur: NEGATIVE mg/dL
Specific Gravity, Urine: 1.02 (ref 1.005–1.030)
pH: 6 (ref 5.0–8.0)

## 2022-11-02 MED ORDER — NITROFURANTOIN MONOHYD MACRO 100 MG PO CAPS: 100.0000 mg | ORAL_CAPSULE | Freq: Two times a day (BID) | ORAL | 0 refills | Status: DC

## 2022-11-02 MED ORDER — FLUCONAZOLE 150 MG PO TABS: 150.0000 mg | ORAL_TABLET | ORAL | 0 refills | Status: AC
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
## 2022-11-02 NOTE — Discharge Instructions (Addendum)
Stop by the pharmacy to pick up your prescriptions.  Follow up with your primary care provider as needed.  

## 2022-11-02 NOTE — ED Triage Notes (Signed)
Pt c/o burning with urination, frequent urination, and foul odor to urine for 3 days.  No fevers.  No abdominal or back pain.

## 2022-11-04 LAB — URINE CULTURE: Culture: >=100000 — AB

## 2022-12-14 ENCOUNTER — Emergency Department
Admission: EM | Admit: 2022-12-14 | Discharge: 2022-12-14 | Disposition: A | Discharge status: Home / Self Care | Payer: Medicaid Other | Attending: Emergency Medicine | Admitting: Emergency Medicine

## 2022-12-14 ENCOUNTER — Other Ambulatory Visit: Payer: Self-pay

## 2022-12-14 ENCOUNTER — Emergency Department: Payer: Medicaid Other

## 2022-12-14 DIAGNOSIS — S62325A Displaced fracture of shaft of fourth metacarpal bone, left hand, initial encounter for closed fracture: Secondary | ICD-10-CM | POA: Insufficient documentation

## 2022-12-14 DIAGNOSIS — Z794 Long term (current) use of insulin: Secondary | ICD-10-CM | POA: Diagnosis not present

## 2022-12-14 DIAGNOSIS — W228XXA Striking against or struck by other objects, initial encounter: Secondary | ICD-10-CM | POA: Diagnosis not present

## 2022-12-14 DIAGNOSIS — M79645 Pain in left finger(s): Secondary | ICD-10-CM | POA: Diagnosis not present

## 2022-12-14 DIAGNOSIS — E119 Type 2 diabetes mellitus without complications: Secondary | ICD-10-CM | POA: Diagnosis not present

## 2022-12-14 DIAGNOSIS — S6992XA Unspecified injury of left wrist, hand and finger(s), initial encounter: Secondary | ICD-10-CM | POA: Diagnosis present

## 2022-12-14 DIAGNOSIS — F17210 Nicotine dependence, cigarettes, uncomplicated: Secondary | ICD-10-CM | POA: Diagnosis not present

## 2022-12-14 MED ORDER — IBUPROFEN 600 MG PO TABS
600.0000 mg | ORAL_TABLET | Freq: Once | ORAL | Status: AC
  Administered 2022-12-14: 600 mg via ORAL
  Filled 2022-12-14: qty 1

## 2022-12-14 MED ORDER — OXYCODONE-ACETAMINOPHEN 5-325 MG PO TABS
1.0000 | ORAL_TABLET | Freq: Once | ORAL | Status: AC
  Administered 2022-12-14: 1 via ORAL
  Filled 2022-12-14: qty 1

## 2022-12-14 MED ORDER — IBUPROFEN 600 MG PO TABS: 600.0000 mg | ORAL_TABLET | Freq: Three times a day (TID) | ORAL | 0 refills | Status: DC | PRN

## 2022-12-14 MED ORDER — OXYCODONE-ACETAMINOPHEN 5-325 MG PO TABS
1.0000 | ORAL_TABLET | ORAL | 0 refills | Status: DC | PRN
Start: 2022-12-14 — End: 2023-02-11

## 2022-12-14 MED ORDER — KETOROLAC TROMETHAMINE 60 MG/2ML IM SOLN
30.0000 mg | Freq: Once | INTRAMUSCULAR | Status: AC
  Administered 2022-12-14: 30 mg via INTRAMUSCULAR
  Filled 2022-12-14: qty 2
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
## 2022-12-14 NOTE — Discharge Instructions (Addendum)
1.  You may take pain medicines as needed (Motrin/Percocet). 2.  Keep splint clean and dry.  Wear sling as needed for comfort. 3.  Return to the ER for worsening symptoms, persistent vomiting, difficulty breathing or other concerns.
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
## 2022-12-16 NOTE — Group Note (Deleted)

## 2022-12-17 ENCOUNTER — Encounter: Payer: Self-pay | Admitting: Advanced Practice Midwife

## 2022-12-17 ENCOUNTER — Ambulatory Visit: Payer: Medicaid Other

## 2022-12-17 ENCOUNTER — Ambulatory Visit: Payer: Medicaid Other | Admitting: Advanced Practice Midwife

## 2022-12-17 VITALS — BP 119/83 | HR 88 | Temp 98.4°F | Wt 123.0 lb

## 2022-12-17 DIAGNOSIS — R87619 Unspecified abnormal cytological findings in specimens from cervix uteri: Secondary | ICD-10-CM | POA: Insufficient documentation

## 2022-12-17 DIAGNOSIS — Z3009 Encounter for other general counseling and advice on contraception: Secondary | ICD-10-CM

## 2022-12-17 DIAGNOSIS — Z30013 Encounter for initial prescription of injectable contraceptive: Secondary | ICD-10-CM

## 2022-12-17 DIAGNOSIS — Z309 Encounter for contraceptive management, unspecified: Secondary | ICD-10-CM | POA: Diagnosis not present

## 2022-12-17 MED ORDER — MEDROXYPROGESTERONE ACETATE 150 MG/ML IM SUSP
150.0000 mg | Freq: Once | INTRAMUSCULAR | Status: AC
Start: 2022-12-17 — End: 2022-12-17
  Administered 2022-12-17: 150 mg via INTRAMUSCULAR

## 2022-12-17 NOTE — Progress Notes (Signed)
Depo given and tolerated well. Reminder card given to call/return around 03/03/23 for next Depo. Tawny Hopping, RN
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
## 2022-12-19 ENCOUNTER — Ambulatory Visit
Admission: RE | Admit: 2022-12-19 | Discharge: 2022-12-19 | Disposition: A | Discharge status: Home / Self Care | Payer: Medicaid Other | Source: Ambulatory Visit | Attending: Emergency Medicine | Admitting: Emergency Medicine

## 2022-12-19 VITALS — BP 103/79 | HR 84 | Temp 98.5°F | Resp 18

## 2022-12-19 DIAGNOSIS — R81 Glycosuria: Secondary | ICD-10-CM

## 2022-12-19 DIAGNOSIS — R7309 Other abnormal glucose: Secondary | ICD-10-CM

## 2022-12-19 DIAGNOSIS — B3731 Acute candidiasis of vulva and vagina: Secondary | ICD-10-CM

## 2022-12-19 DIAGNOSIS — R739 Hyperglycemia, unspecified: Secondary | ICD-10-CM

## 2022-12-19 LAB — URINALYSIS, W/ REFLEX TO CULTURE (INFECTION SUSPECTED)
Bilirubin Urine: NEGATIVE
Glucose, UA: >=500 mg/dL — AB
Ketones, ur: NEGATIVE mg/dL
Leukocytes,Ua: NEGATIVE
Nitrite: NEGATIVE
Protein, ur: NEGATIVE mg/dL
Specific Gravity, Urine: 1.01 (ref 1.005–1.030)
pH: 5.5 (ref 5.0–8.0)

## 2022-12-19 LAB — WET PREP, GENITAL
Clue Cells Wet Prep HPF POC: NONE SEEN
Sperm: NONE SEEN
Trich, Wet Prep: NONE SEEN
WBC, Wet Prep HPF POC: NONE SEEN — AB (ref <10)

## 2022-12-19 LAB — GLUCOSE, CAPILLARY: Glucose-Capillary: 300 mg/dL — ABNORMAL HIGH (ref 70–99)

## 2022-12-19 MED ORDER — FLUCONAZOLE 150 MG PO TABS
150.0000 mg | ORAL_TABLET | ORAL | 0 refills | Status: AC
Start: 2022-12-19 — End: 2022-12-26

## 2022-12-19 NOTE — ED Triage Notes (Signed)
Pt presents with vaginal discharge and itching x 2 days
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
## 2022-12-19 NOTE — Discharge Instructions (Addendum)
Your vaginal swab did show that you have a vaginal yeast infection which are negative for bacterial vaginosis.  You also did not have a urinary tract infection though you are spilling a large amount of glucose in your urine which may be accounting for some of the burning, along with the vaginal yeast infection.  Take the Diflucan 150 mg tablets now and repeat dosing every 3 days for a total of 3 doses.  Take your diabetic medication as prescribed for control of your blood sugar.

## 2022-12-31 ENCOUNTER — Ambulatory Visit: Payer: Medicaid Other

## 2023-01-11 IMAGING — US US PELVIS COMPLETE WITH TRANSVAGINAL
1 series · 14 of 25 positions shown · non-contrast
Comparison: None

CLINICAL DATA: Vaginal bleeding for 8 days. Passing large clots.
Negative urine pregnancy test.

EXAM:
TRANSABDOMINAL AND TRANSVAGINAL ULTRASOUND OF PELVIS
TECHNIQUE: Both transabdominal and transvaginal ultrasound examinations of the
pelvis were performed. Transabdominal technique was performed for
global imaging of the pelvis including uterus, ovaries, adnexal
regions, and pelvic cul-de-sac. It was necessary to proceed with
endovaginal exam following the transabdominal exam to visualize the
ovaries and endometrium.

[Series 1: us pelvic complete with transvaginal · 14 of 116 slices shown]
[im 1/116]
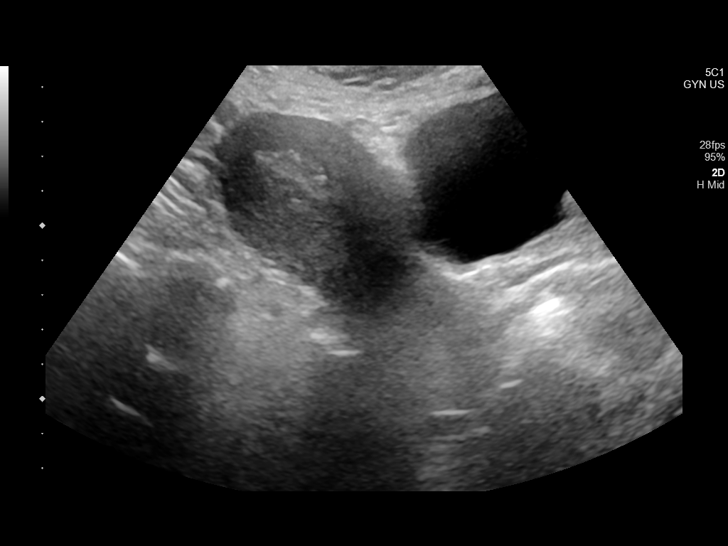
[im 10/116]
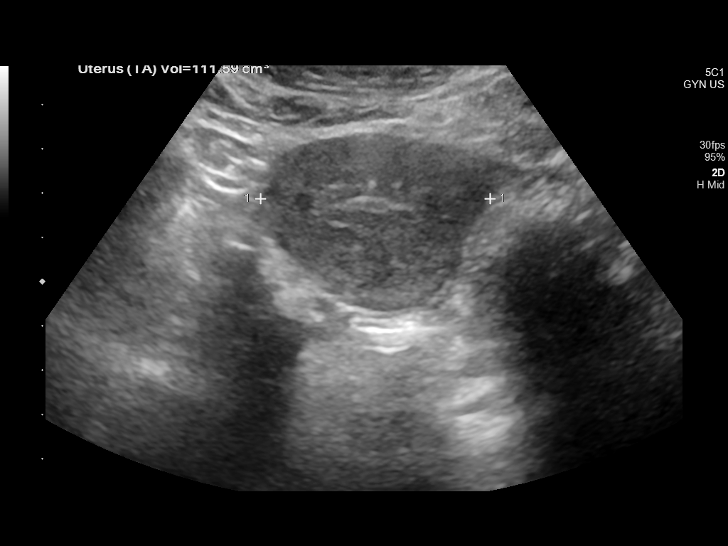
[im 20/116]
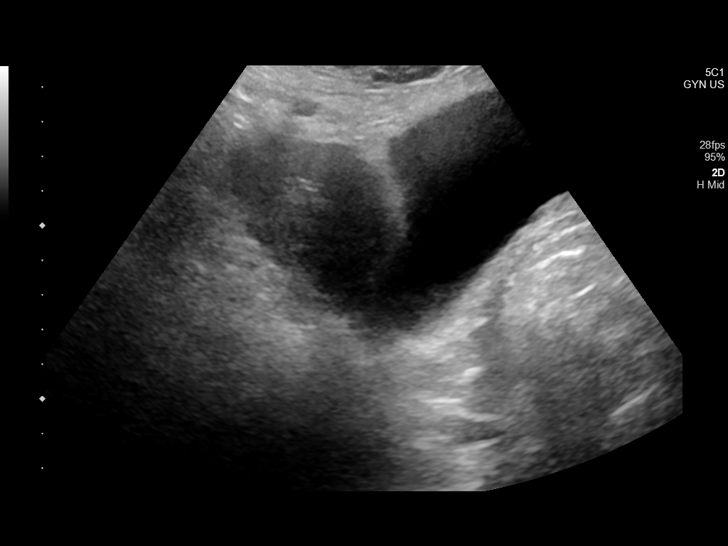
[im 29/116]
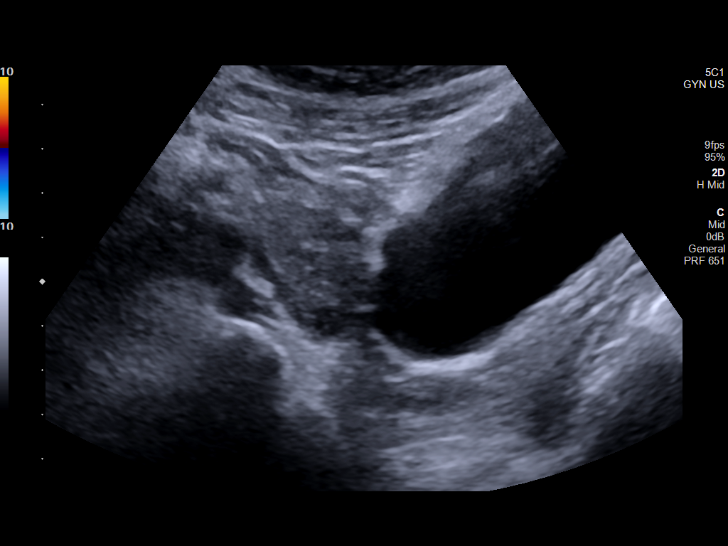
[im 39/116]
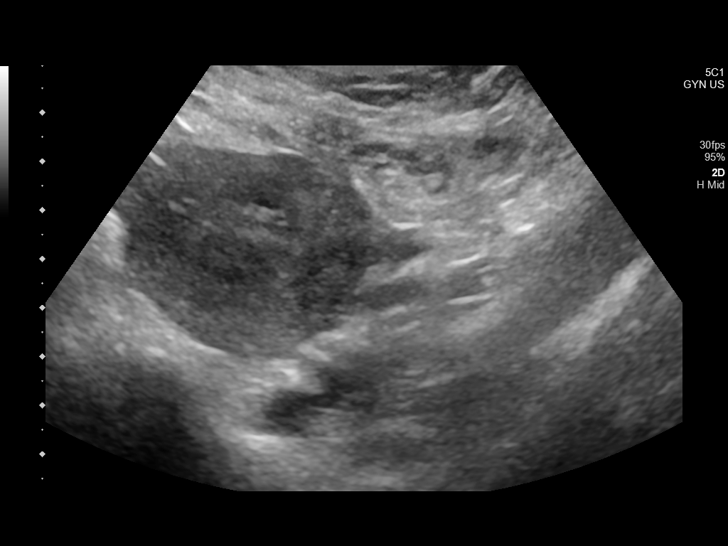
[im 44/116]
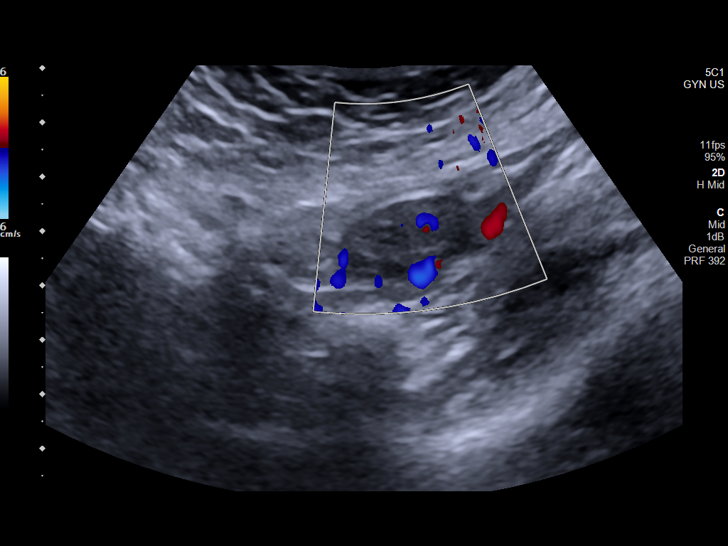
[im 53/116]
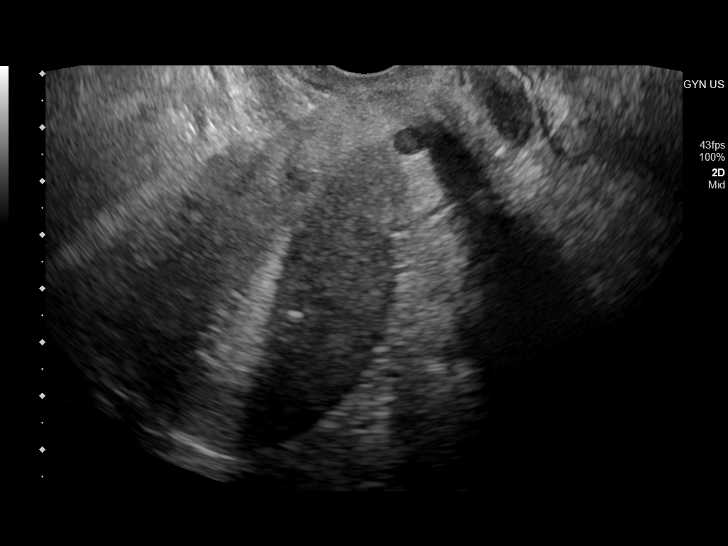
[im 63/116]
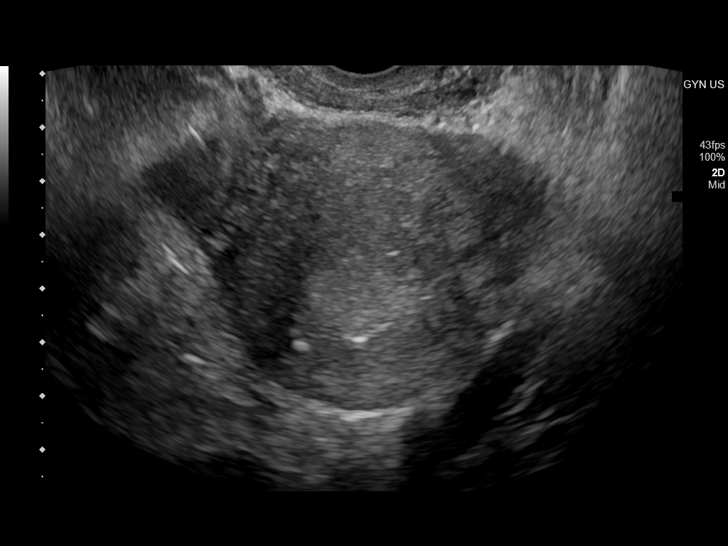
[im 72/116]
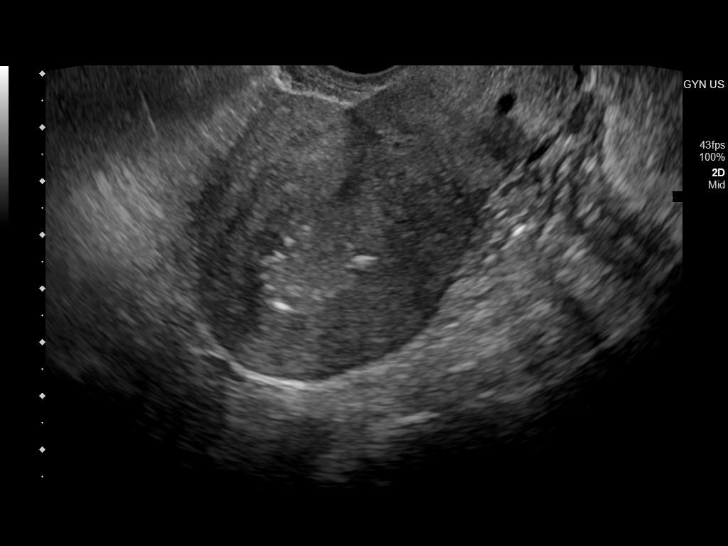
[im 77/116]
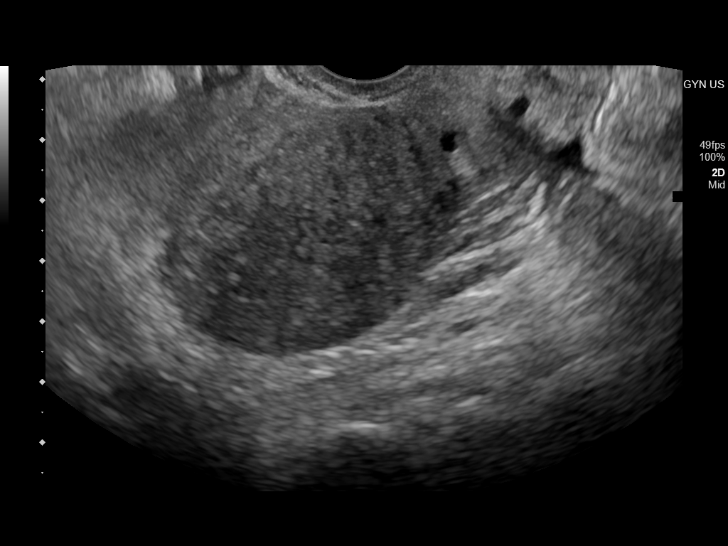
[im 87/116]
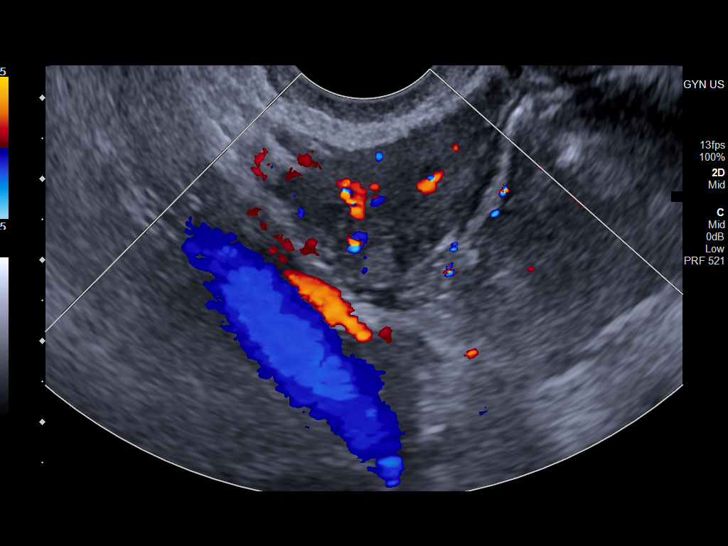
[im 96/116]
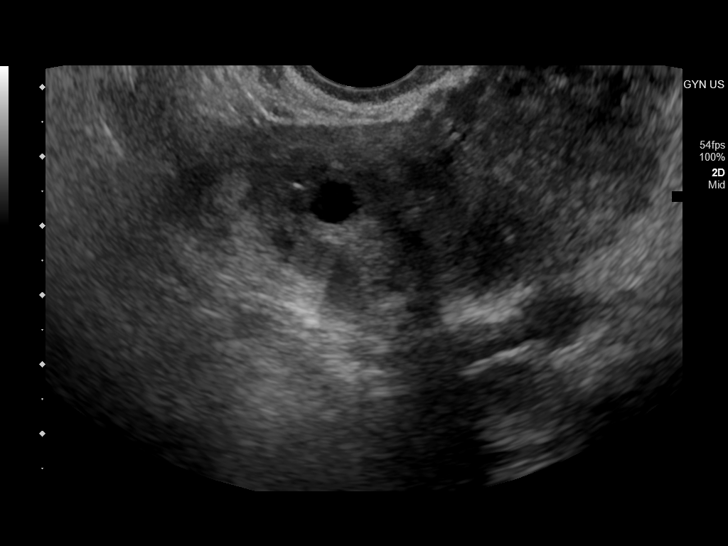
[im 106/116]
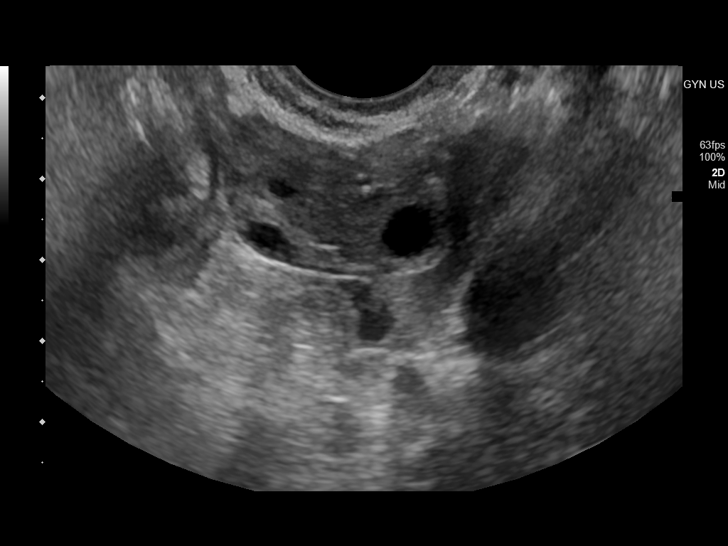
[im 116/116]
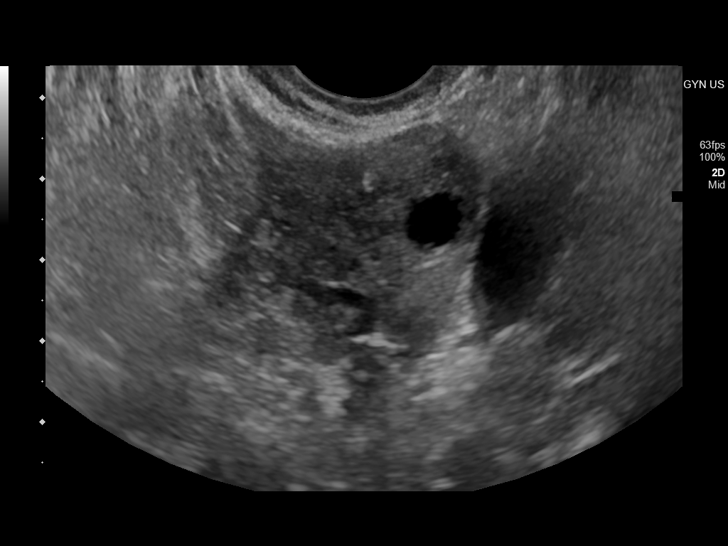

[14 of 25 positions shown; findings below may reference images not displayed]

FINDINGS: Uterus

Measurements: 9.4 x 4.4 x 5.2 cm = volume: 112 mL. Uterus is
anteverted. No focal myometrial lesions identified. Small nabothian
cysts in the cervix.

Endometrium

Thickness: 11 mm.  No focal abnormality visualized.

Right ovary

Measurements: 3 x 1.9 x 2.9 cm = volume: 9 mL. Normal appearance/no
adnexal mass.

Left ovary

Measurements: 3.6 x 1.7 x 2.7 cm = volume: 9 mL. Normal
appearance/no adnexal mass.

Other findings

Minimal free fluid in the pelvis.
IMPRESSION: Normal ultrasound appearance of the uterus and ovaries.

## 2023-01-20 ENCOUNTER — Ambulatory Visit: Payer: Medicaid Other

## 2023-02-11 ENCOUNTER — Encounter: Payer: Self-pay | Admitting: Emergency Medicine

## 2023-02-11 ENCOUNTER — Emergency Department: Payer: Medicaid Other

## 2023-02-11 ENCOUNTER — Emergency Department
Admission: EM | Admit: 2023-02-11 | Discharge: 2023-02-11 | Disposition: A | Discharge status: Home / Self Care | Payer: Medicaid Other | Attending: Emergency Medicine | Admitting: Emergency Medicine

## 2023-02-11 ENCOUNTER — Other Ambulatory Visit: Payer: Self-pay

## 2023-02-11 DIAGNOSIS — M79671 Pain in right foot: Secondary | ICD-10-CM | POA: Insufficient documentation

## 2023-02-11 DIAGNOSIS — Z794 Long term (current) use of insulin: Secondary | ICD-10-CM | POA: Insufficient documentation

## 2023-02-11 DIAGNOSIS — F17200 Nicotine dependence, unspecified, uncomplicated: Secondary | ICD-10-CM | POA: Diagnosis not present

## 2023-02-11 DIAGNOSIS — E119 Type 2 diabetes mellitus without complications: Secondary | ICD-10-CM | POA: Diagnosis not present

## 2023-02-11 MED ORDER — OXYCODONE-ACETAMINOPHEN 5-325 MG PO TABS
1.0000 | ORAL_TABLET | ORAL | 0 refills | Status: AC | PRN
Start: 2023-02-11 — End: ?

## 2023-02-11 MED ORDER — KETOROLAC TROMETHAMINE 60 MG/2ML IM SOLN
30.0000 mg | Freq: Once | INTRAMUSCULAR | Status: AC
  Administered 2023-02-11: 30 mg via INTRAMUSCULAR
  Filled 2023-02-11: qty 2

## 2023-02-11 MED ORDER — OXYCODONE-ACETAMINOPHEN 5-325 MG PO TABS
1.0000 | ORAL_TABLET | Freq: Once | ORAL | Status: AC
  Administered 2023-02-11: 1 via ORAL
  Filled 2023-02-11: qty 1

## 2023-02-11 NOTE — ED Triage Notes (Signed)
Pt to triage via w/c with not distress noted; pt report pain rt ankle/foot with no known injury; no swelling noted

## 2023-02-11 NOTE — Discharge Instructions (Addendum)
You may take Ibuprofen as needed for pain; Percocet as needed for more severe pain.  Wear podiatric shoe and use crutches to help you walk as needed.  Return to the ER for worsening symptoms or other concerns.
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
## 2023-02-11 NOTE — ED Notes (Signed)
Pt given set of crutches and podiatric shoe.  Pt given instructions on use of shoes and states understanding.

## 2023-02-14 ENCOUNTER — Ambulatory Visit
Admission: RE | Admit: 2023-02-14 | Discharge: 2023-02-14 | Disposition: A | Discharge status: Home / Self Care | Payer: Medicaid Other | Source: Ambulatory Visit | Attending: Physician Assistant | Admitting: Physician Assistant

## 2023-02-14 VITALS — BP 117/76 | HR 106 | Temp 99.2°F | Resp 14 | Ht 60.0 in | Wt 123.0 lb

## 2023-02-14 DIAGNOSIS — N760 Acute vaginitis: Secondary | ICD-10-CM | POA: Diagnosis present

## 2023-02-14 DIAGNOSIS — B9689 Other specified bacterial agents as the cause of diseases classified elsewhere: Secondary | ICD-10-CM | POA: Insufficient documentation

## 2023-02-14 DIAGNOSIS — Z113 Encounter for screening for infections with a predominantly sexual mode of transmission: Secondary | ICD-10-CM | POA: Diagnosis present

## 2023-02-14 DIAGNOSIS — T3695XA Adverse effect of unspecified systemic antibiotic, initial encounter: Secondary | ICD-10-CM | POA: Diagnosis present

## 2023-02-14 DIAGNOSIS — B379 Candidiasis, unspecified: Secondary | ICD-10-CM | POA: Insufficient documentation

## 2023-02-14 LAB — URINALYSIS, W/ REFLEX TO CULTURE (INFECTION SUSPECTED)
Bilirubin Urine: NEGATIVE
Glucose, UA: 500 mg/dL — AB
Ketones, ur: NEGATIVE mg/dL
Leukocytes,Ua: NEGATIVE
Nitrite: NEGATIVE
Protein, ur: NEGATIVE mg/dL
Specific Gravity, Urine: 1.01 (ref 1.005–1.030)
WBC, UA: NONE SEEN WBC/hpf (ref 0–5)
pH: 5.5 (ref 5.0–8.0)

## 2023-02-14 LAB — WET PREP, GENITAL
Sperm: NONE SEEN
Trich, Wet Prep: NONE SEEN
WBC, Wet Prep HPF POC: <10 — AB (ref <10)
Yeast Wet Prep HPF POC: NONE SEEN

## 2023-02-14 MED ORDER — FLUCONAZOLE 150 MG PO TABS: ORAL_TABLET | ORAL | 0 refills | Status: DC

## 2023-02-14 MED ORDER — METRONIDAZOLE 500 MG PO TABS: 500.0000 mg | ORAL_TABLET | Freq: Two times a day (BID) | ORAL | 0 refills | Status: AC

## 2023-02-14 NOTE — ED Triage Notes (Signed)
Patient c/o vaginal discharge and itching that started 2 days ago.  Patient also reports some burning discomfort.  Patient wants STD test.

## 2023-02-14 NOTE — Discharge Instructions (Signed)
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
## 2023-02-17 ENCOUNTER — Telehealth (HOSPITAL_BASED_OUTPATIENT_CLINIC_OR_DEPARTMENT_OTHER): Payer: Self-pay

## 2023-02-17 LAB — CERVICOVAGINAL ANCILLARY ONLY
Chlamydia: NEGATIVE
Comment: NEGATIVE
Comment: NORMAL
Neisseria Gonorrhea: POSITIVE — AB

## 2023-02-17 NOTE — Telephone Encounter (Signed)
Per protocol, pt will need to return to UC for Nurse Visit for Rocephin 500mg  IM.  Appt set for 02/18/23 at 12pm

## 2023-02-18 ENCOUNTER — Ambulatory Visit
Admission: RE | Admit: 2023-02-18 | Discharge: 2023-02-18 | Disposition: A | Discharge status: Home / Self Care | Payer: Medicaid Other | Source: Ambulatory Visit | Attending: Emergency Medicine | Admitting: Emergency Medicine

## 2023-02-18 DIAGNOSIS — A549 Gonococcal infection, unspecified: Secondary | ICD-10-CM | POA: Diagnosis not present

## 2023-02-18 MED ORDER — CEFTRIAXONE SODIUM 500 MG IJ SOLR
500.0000 mg | Freq: Once | INTRAMUSCULAR | Status: AC
  Administered 2023-02-18: 500 mg via INTRAMUSCULAR

## 2023-02-18 NOTE — ED Triage Notes (Signed)
Pt presents for treatment of gonorrhea.

## 2023-02-27 IMAGING — US US PELVIS COMPLETE WITH TRANSVAGINAL
1 series · 13 of 25 positions shown · non-contrast
Comparison: 06/20/2021

CLINICAL DATA: Left lower quadrant pain, IUD placement

EXAM:
TRANSABDOMINAL AND TRANSVAGINAL ULTRASOUND OF PELVIS
TECHNIQUE: Both transabdominal and transvaginal ultrasound examinations of the
pelvis were performed. Transabdominal technique was performed for
global imaging of the pelvis including uterus, ovaries, adnexal
regions, and pelvic cul-de-sac. It was necessary to proceed with
endovaginal exam following the transabdominal exam to visualize the
endometrium and adnexal structures.

[Series 1: us pelvis (transabdominal only) · 13 of 74 slices shown]
[im 1/74]
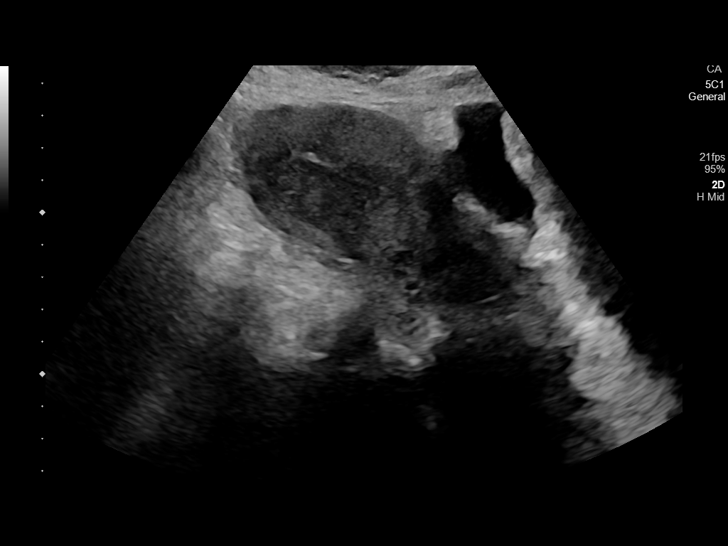
[im 7/74]
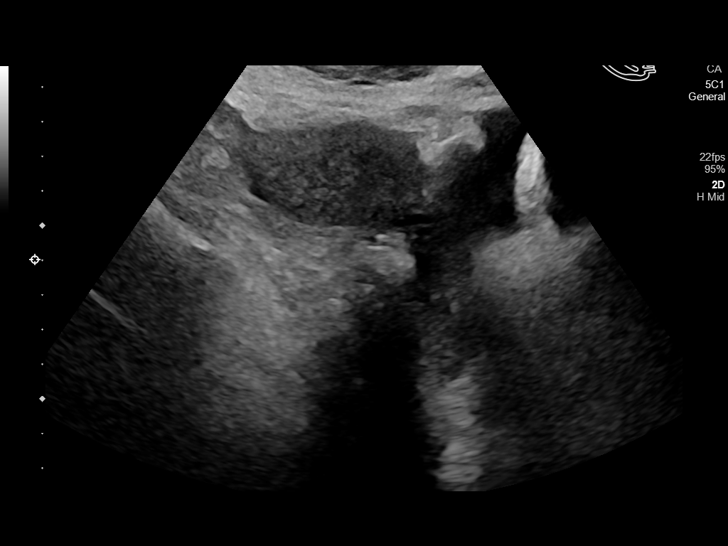
[im 13/74]
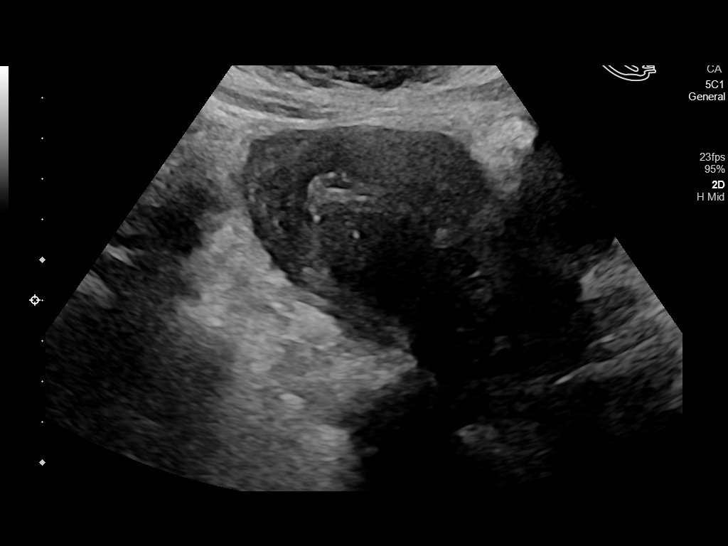
[im 19/74]
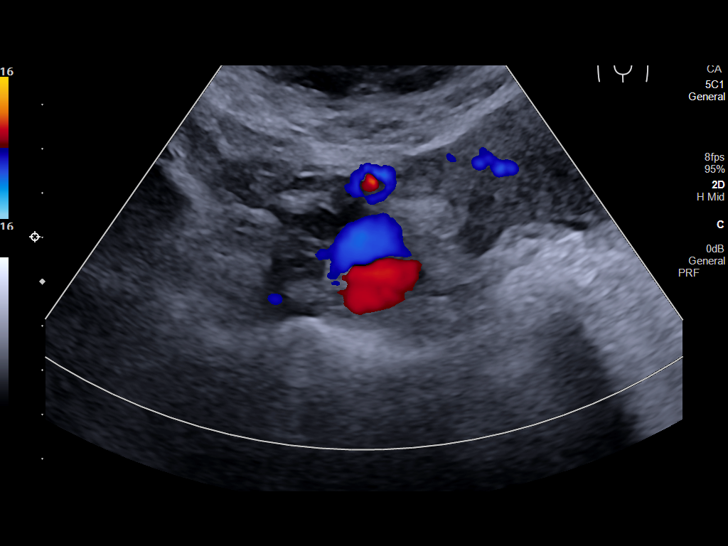
[im 25/74]
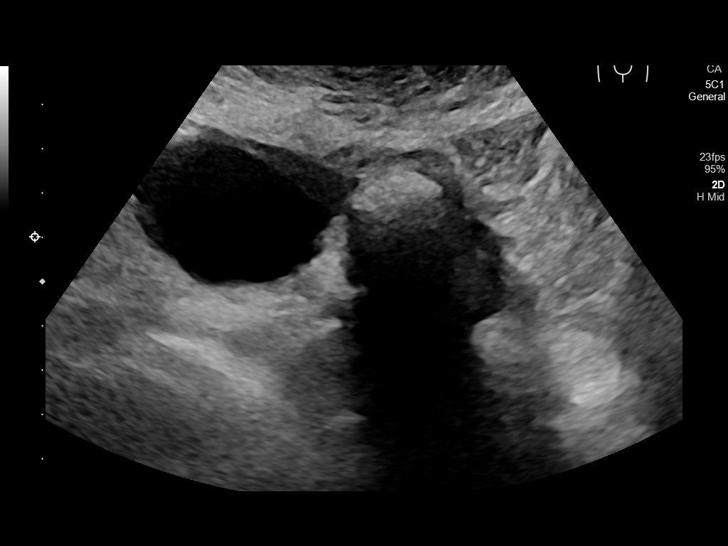
[im 31/74]
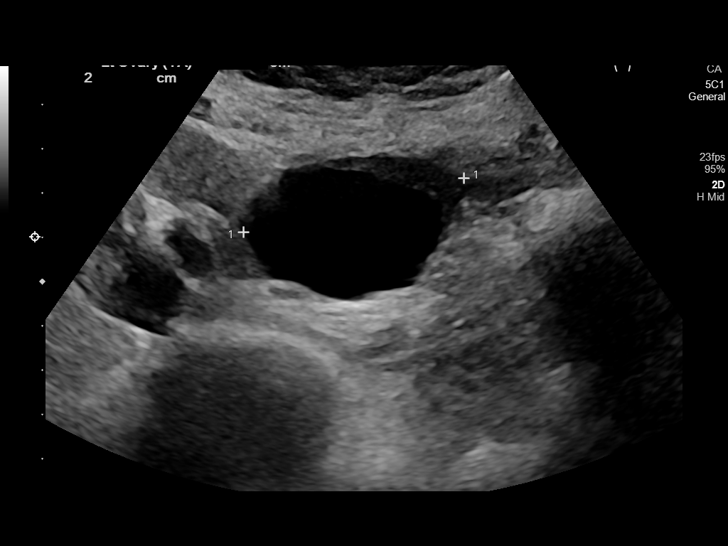
[im 37/74]
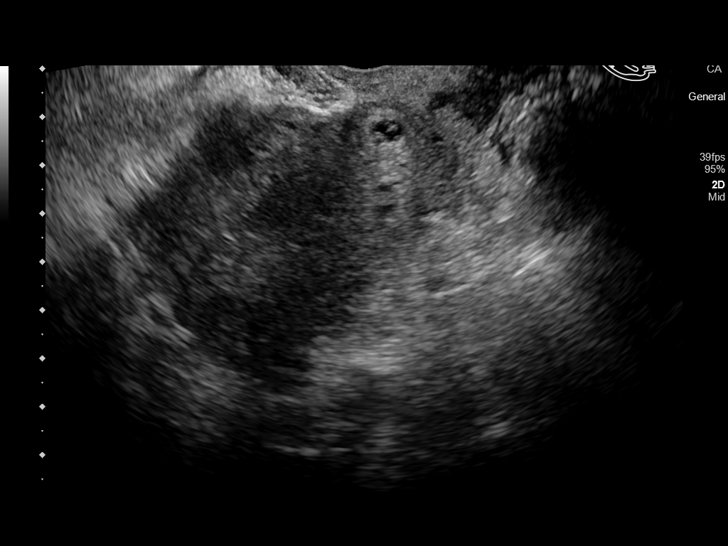
[im 43/74]
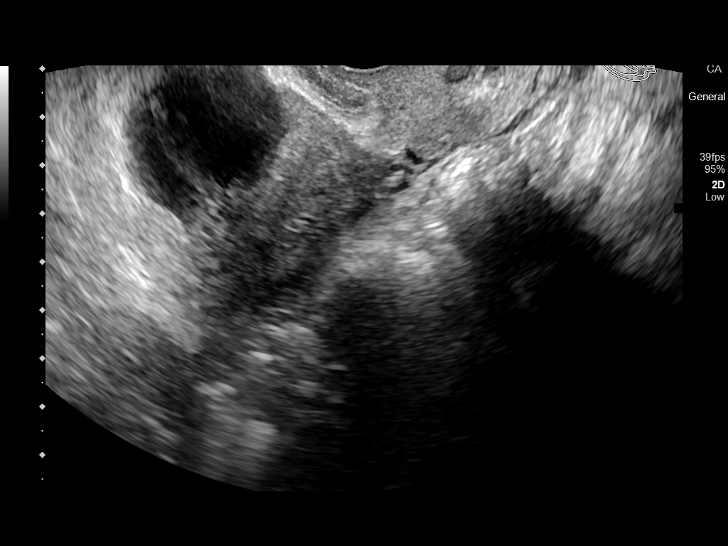
[im 49/74]
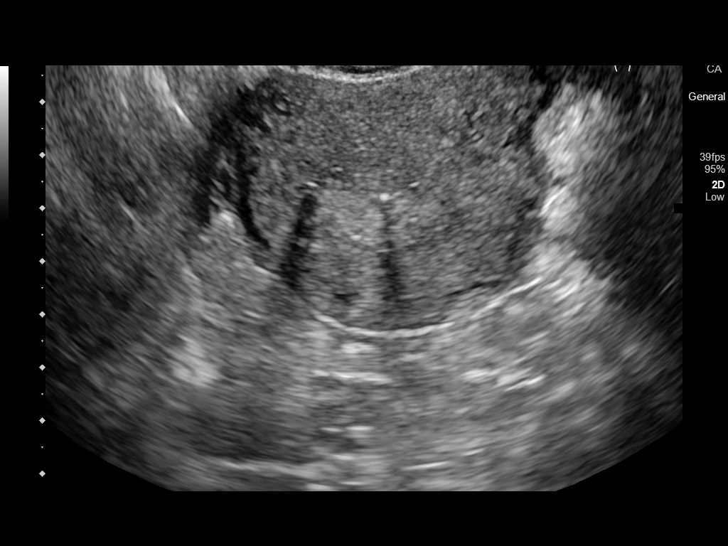
[im 55/74]
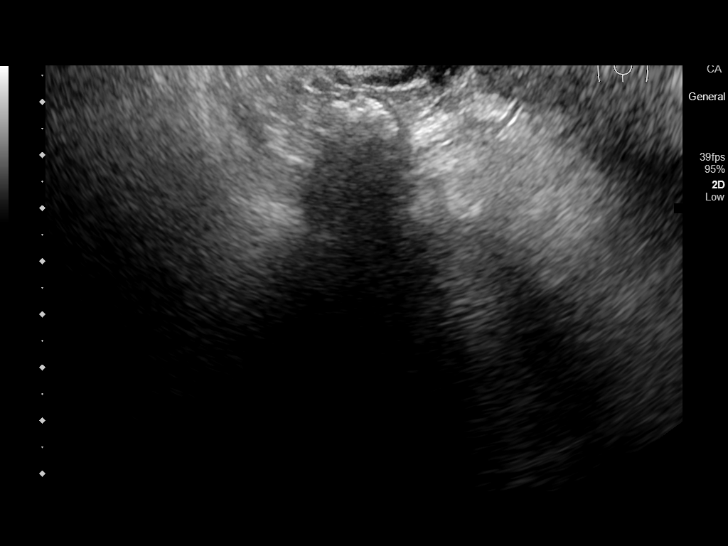
[im 61/74]
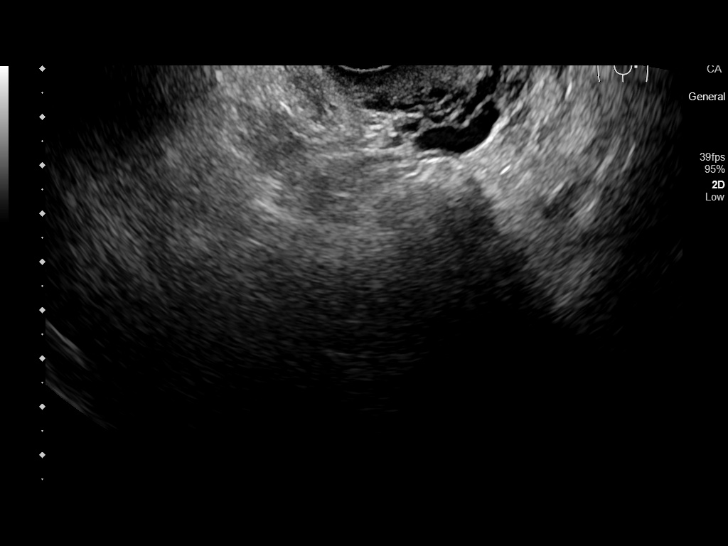
[im 67/74]
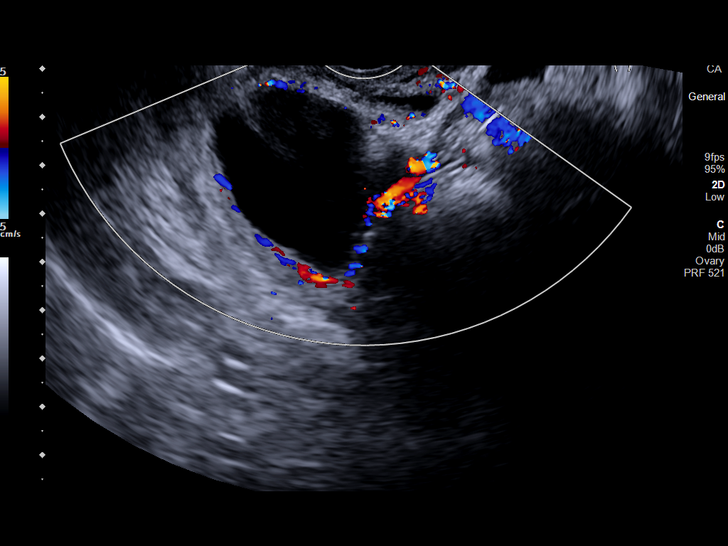
[im 74/74]
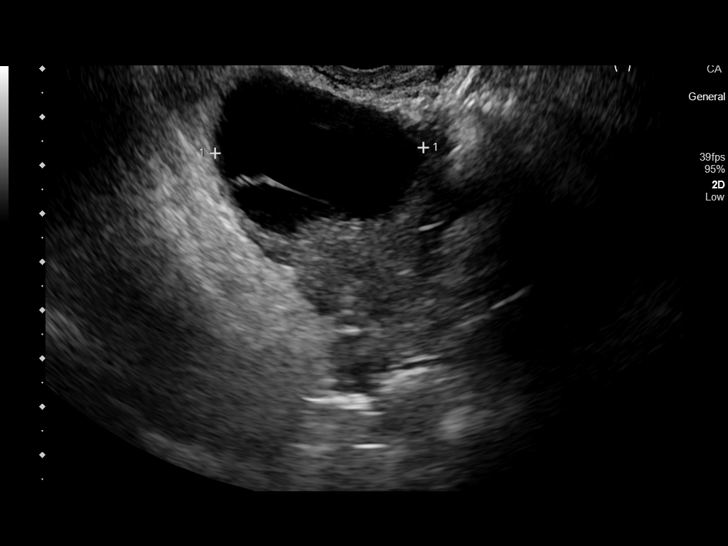

[13 of 25 positions shown; findings below may reference images not displayed]

FINDINGS: Uterus

Measurements: 9.4 x 5.6 x 6.2 cm = volume: 168 mL. No fibroids or
other mass visualized.

Endometrium

Thickness: 14 mm.  IUD is identified within the endometrial cavity.

Right ovary

Measurements: 2.9 x 1.7 x 1.4 cm = volume: 3.6 mL. Normal
appearance/no adnexal mass.

Left ovary

Measurements: 5.3 x 3.6 by 4.6 cm = volume: 45.4 mL. There is a
x 3.2 by 4.3 cm left ovarian cyst, with a single thin septation, new
since prior study. No other adnexal masses.

Other findings

No abnormal free fluid.
IMPRESSION: 1. Minimally complex 4.5 cm left ovarian cyst, which has developed
in the interim since prior exam. This likely reflects a functional
cyst. Follow-up ultrasound in 6-8 weeks could be performed to
document interval resolution.
2. IUD within the endometrial cavity.
3. Otherwise unremarkable exam.

## 2023-03-04 ENCOUNTER — Ambulatory Visit: Payer: Medicaid Other

## 2023-03-04 VITALS — BP 124/73 | Ht 65.0 in | Wt 120.0 lb

## 2023-03-04 DIAGNOSIS — Z30013 Encounter for initial prescription of injectable contraceptive: Secondary | ICD-10-CM | POA: Diagnosis not present

## 2023-03-04 DIAGNOSIS — Z309 Encounter for contraceptive management, unspecified: Secondary | ICD-10-CM

## 2023-03-04 DIAGNOSIS — Z3009 Encounter for other general counseling and advice on contraception: Secondary | ICD-10-CM

## 2023-03-04 DIAGNOSIS — Z3042 Encounter for surveillance of injectable contraceptive: Secondary | ICD-10-CM

## 2023-03-04 MED ORDER — MEDROXYPROGESTERONE ACETATE 150 MG/ML IM SUSP
150.0000 mg | Freq: Once | INTRAMUSCULAR | Status: AC
Start: 2023-03-04 — End: 2023-03-04
  Administered 2023-03-04: 150 mg via INTRAMUSCULAR

## 2023-03-04 NOTE — Progress Notes (Signed)
11 weeks post depo.  States she has bled daily since depo was given.  Amount varies light to heavy.  Pt is past due annual exam (last annual exam 10/30/21).   Consulted Aliene Altes FNP and instructed pt to take Ibuprofen 800 mg 3 times a day x 5 days to help with bleeding.  Follow this medication regimen once a month at the start of menses until returns for next depo.  Pt expressed understanding and written instructions given.  erbal order by FNP to administer depo injection today and pt needs to return for annual exam before next depo due.    Depo given IM left deltoid per VO by Aliene Altes FNP; tolerated well.    Appt made for 03/20/23 for annual exam and stressed importance of keeping appt; appt card given.  Next depo due 05/20/23; appt reminder given to call for appt.  Cherlynn Polo, RN

## 2023-03-19 ENCOUNTER — Ambulatory Visit: Payer: Medicaid Other

## 2023-03-20 ENCOUNTER — Ambulatory Visit
Admission: RE | Admit: 2023-03-20 | Discharge: 2023-03-20 | Disposition: A | Discharge status: Home / Self Care | Payer: Medicaid Other | Source: Ambulatory Visit | Attending: Emergency Medicine | Admitting: Emergency Medicine

## 2023-03-20 ENCOUNTER — Ambulatory Visit: Payer: Medicaid Other

## 2023-03-20 VITALS — BP 126/77 | HR 98 | Temp 98.9°F | Resp 18

## 2023-03-20 DIAGNOSIS — N3000 Acute cystitis without hematuria: Secondary | ICD-10-CM

## 2023-03-20 DIAGNOSIS — Z789 Other specified health status: Secondary | ICD-10-CM | POA: Diagnosis present

## 2023-03-20 DIAGNOSIS — Z113 Encounter for screening for infections with a predominantly sexual mode of transmission: Secondary | ICD-10-CM

## 2023-03-20 LAB — POCT URINALYSIS DIP (MANUAL ENTRY)
Bilirubin, UA: NEGATIVE
Blood, UA: NEGATIVE
Glucose, UA: 500 mg/dL — AB
Ketones, POC UA: NEGATIVE mg/dL
Leukocytes, UA: NEGATIVE
Nitrite, UA: POSITIVE — AB
Protein Ur, POC: NEGATIVE mg/dL
Spec Grav, UA: 1.015
Urobilinogen, UA: 1 U/dL
pH, UA: 6.5

## 2023-03-20 LAB — POCT URINE PREGNANCY: Preg Test, Ur: NEGATIVE

## 2023-03-20 MED ORDER — SULFAMETHOXAZOLE-TRIMETHOPRIM 800-160 MG PO TABS: 1.0000 | ORAL_TABLET | Freq: Two times a day (BID) | ORAL | 0 refills | Status: AC

## 2023-03-20 NOTE — ED Triage Notes (Signed)
Pt states her urinae has a foul smell, vaginal discharge, lower abdominal pain and lower back pain x 2 days. Taking ibuprofen.
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
## 2023-03-20 NOTE — Discharge Instructions (Addendum)
Common causes of urinary tract infections include but are not limited to holding your urine longer than you should, squatting instead of sitting down when urinating, sitting around in wet clothing such as a wet swimsuit or gym clothes too long, not emptying your bladder after having sexual intercourse, wiping from back to front instead of front to back after having a bowel movement.     Less common causes of urinary tract infections include but are not limited to anatomical shifts in the location of your bladder or uterus causing obstruction of passage of urine from your bladder to your urethra where your urine comes out or prolapse of your rectum into your vaginal wall.  These less common causes can be evaluated by gynecologist, a urologist or subspecialist called a uro-gynecologist   The urinalysis that we performed in the clinic today was abnormal.     You were advised to begin antibiotics today because your urinalysis is abnormal and you are having active symptoms of an acute lower urinary tract infection also known as cystitis.     Please pick up and begin taking your prescription for Bactrim DS (trimethoprim sulfamethoxazole) as soon as possible.  Please take all doses exactly as prescribed.  You can take this medication with or without food.  This medication is safe to take with your other medications.   I the results of your vaginal swab test which screens for BV, yeast, gonorrhea, chlamydia and trichomonas will be made posted to your MyChart account once it is complete.  This typically takes 2 to 4 days.  Please abstain from sexual intercourse of any kind, vaginal, oral or anal, until you have received the results of your STD testing.      If any of your results are abnormal, you will receive a phone call regarding treatment.  Prescriptions, if any are needed, will be provided for you at your pharmacy.      Your urine pregnancy test today is negative.     Please remember that there are only  2 ways to prevent transmission of sexually transmitted disease: to abstain from sexual intercourse or to always use condoms when having sexual intercourse. Repeat sexually transmitted infections can reduce your ability to conceive and to have children and increase your risk of contracting syphilis, HIV and HPV, the highly contagious human papilloma virus which causes cervical cancer and genital warts.      If you have not had complete resolution of your symptoms after completing treatment as prescribed, please return to urgent care for repeat evaluation or follow-up with your primary care provider.  Repeat urinalysis and urine culture and/or repeat STD testing may be indicated for more directed therapy.   Thank you for visiting [facility] today.  We appreciate the opportunity to participate in your care.

## 2023-03-21 ENCOUNTER — Telehealth (HOSPITAL_COMMUNITY): Payer: Self-pay

## 2023-03-21 LAB — CERVICOVAGINAL ANCILLARY ONLY
Bacterial Vaginitis (gardnerella): POSITIVE — AB
Candida Glabrata: NEGATIVE
Candida Vaginitis: POSITIVE — AB
Chlamydia: NEGATIVE
Comment: NEGATIVE
Comment: NEGATIVE
Comment: NEGATIVE
Comment: NEGATIVE
Comment: NEGATIVE
Comment: NORMAL
Neisseria Gonorrhea: NEGATIVE
Trichomonas: NEGATIVE

## 2023-03-21 MED ORDER — FLUCONAZOLE 150 MG PO TABS: 150.0000 mg | ORAL_TABLET | Freq: Once | ORAL | 0 refills | Status: AC

## 2023-03-21 MED ORDER — METRONIDAZOLE 500 MG PO TABS: 500.0000 mg | ORAL_TABLET | Freq: Two times a day (BID) | ORAL | 0 refills | Status: AC

## 2023-03-21 NOTE — Telephone Encounter (Signed)
Per protocol, pt requires tx with metronidazole and Diflucan.  Rx sent to pharmacy on file.

## 2023-05-16 ENCOUNTER — Ambulatory Visit
Admission: RE | Admit: 2023-05-16 | Discharge: 2023-05-16 | Disposition: A | Discharge status: Home / Self Care | Payer: Medicaid Other | Source: Ambulatory Visit | Attending: Emergency Medicine | Admitting: Emergency Medicine

## 2023-05-16 VITALS — BP 119/82 | HR 102 | Temp 99.6°F | Resp 14 | Ht 65.0 in | Wt 119.9 lb

## 2023-05-16 DIAGNOSIS — N760 Acute vaginitis: Secondary | ICD-10-CM | POA: Diagnosis present

## 2023-05-16 DIAGNOSIS — N898 Other specified noninflammatory disorders of vagina: Secondary | ICD-10-CM | POA: Diagnosis present

## 2023-05-16 DIAGNOSIS — R1030 Lower abdominal pain, unspecified: Secondary | ICD-10-CM | POA: Diagnosis present

## 2023-05-16 DIAGNOSIS — B9689 Other specified bacterial agents as the cause of diseases classified elsewhere: Secondary | ICD-10-CM | POA: Diagnosis present

## 2023-05-16 DIAGNOSIS — R739 Hyperglycemia, unspecified: Secondary | ICD-10-CM | POA: Insufficient documentation

## 2023-05-16 DIAGNOSIS — B379 Candidiasis, unspecified: Secondary | ICD-10-CM | POA: Insufficient documentation

## 2023-05-16 DIAGNOSIS — B349 Viral infection, unspecified: Secondary | ICD-10-CM

## 2023-05-16 DIAGNOSIS — Z113 Encounter for screening for infections with a predominantly sexual mode of transmission: Secondary | ICD-10-CM | POA: Diagnosis present

## 2023-05-16 LAB — WET PREP, GENITAL
Sperm: NONE SEEN
Trich, Wet Prep: NONE SEEN
WBC, Wet Prep HPF POC: >10 — AB (ref <10)

## 2023-05-16 LAB — URINALYSIS, W/ REFLEX TO CULTURE (INFECTION SUSPECTED)
Bilirubin Urine: NEGATIVE
Glucose, UA: 500 mg/dL — AB
Hgb urine dipstick: NEGATIVE
Ketones, ur: NEGATIVE mg/dL
Leukocytes,Ua: NEGATIVE
Nitrite: NEGATIVE
Protein, ur: NEGATIVE mg/dL
RBC / HPF: NONE SEEN RBC/hpf (ref 0–5)
Specific Gravity, Urine: 1.01 (ref 1.005–1.030)
pH: 6 (ref 5.0–8.0)

## 2023-05-16 LAB — GLUCOSE, CAPILLARY: Glucose-Capillary: 554 mg/dL (ref 70–99)

## 2023-05-16 MED ORDER — FLUCONAZOLE 150 MG PO TABS: ORAL_TABLET | ORAL | 0 refills | Status: DC

## 2023-05-16 MED ORDER — METRONIDAZOLE 500 MG PO TABS: 500.0000 mg | ORAL_TABLET | Freq: Two times a day (BID) | ORAL | 0 refills | Status: DC

## 2023-05-16 NOTE — ED Triage Notes (Signed)
 Patient reports vaginal discharge that started 2 days.  Patient reports lower abdominal pain.
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
## 2023-05-16 NOTE — ED Notes (Signed)
 Patient is being discharged from the Urgent Care and sent to the Emergency Department via POV . Per Rilla Flood, NP, patient is in need of higher level of care due to Hyperglycemia. Patient is aware and verbalizes understanding of plan of care.  Vitals:   05/16/23 1840  BP: 119/82  Pulse: (!) 102  Resp: 14  Temp: 99.6 F (37.6 C)  SpO2: 100%

## 2023-05-16 NOTE — Discharge Instructions (Addendum)
 Go to Er for further evaluation of hyperglycemia(labs, IV fluids, insulin ) Take Flagyl  and Diflucan  as prescribed for BV and yeast infection Make sure to take your medication as prescribed do not skip any, do not save any, do not share any Safe sex Avoid sexual activity until results are known from STI testing,check my chart for results

## 2023-05-19 LAB — CERVICOVAGINAL ANCILLARY ONLY
Chlamydia: NEGATIVE
Comment: NEGATIVE
Comment: NORMAL
Neisseria Gonorrhea: NEGATIVE

## 2023-06-11 ENCOUNTER — Ambulatory Visit
Admission: RE | Admit: 2023-06-11 | Discharge: 2023-06-11 | Disposition: A | Discharge status: Home / Self Care | Payer: Self-pay | Source: Ambulatory Visit | Attending: Emergency Medicine | Admitting: Emergency Medicine

## 2023-06-11 VITALS — BP 125/81 | HR 89 | Temp 98.6°F | Resp 16

## 2023-06-11 DIAGNOSIS — R739 Hyperglycemia, unspecified: Secondary | ICD-10-CM

## 2023-06-11 DIAGNOSIS — Z91199 Patient's noncompliance with other medical treatment and regimen due to unspecified reason: Secondary | ICD-10-CM

## 2023-06-11 DIAGNOSIS — R102 Pelvic and perineal pain: Secondary | ICD-10-CM

## 2023-06-11 DIAGNOSIS — R300 Dysuria: Secondary | ICD-10-CM | POA: Diagnosis present

## 2023-06-11 DIAGNOSIS — B3731 Acute candidiasis of vulva and vagina: Secondary | ICD-10-CM | POA: Diagnosis not present

## 2023-06-11 DIAGNOSIS — Z91148 Patient's other noncompliance with medication regimen for other reason: Secondary | ICD-10-CM

## 2023-06-11 LAB — URINALYSIS, W/ REFLEX TO CULTURE (INFECTION SUSPECTED)
Bilirubin Urine: NEGATIVE
Glucose, UA: >=500 mg/dL — AB
Ketones, ur: NEGATIVE mg/dL
Leukocytes,Ua: NEGATIVE
Nitrite: POSITIVE — AB
Protein, ur: NEGATIVE mg/dL
Specific Gravity, Urine: 1.01 (ref 1.005–1.030)
pH: 5.5 (ref 5.0–8.0)

## 2023-06-11 LAB — GLUCOSE, CAPILLARY: Glucose-Capillary: 432 mg/dL — ABNORMAL HIGH (ref 70–99)

## 2023-06-11 MED ORDER — CEFTRIAXONE SODIUM 1 G IJ SOLR
1.0000 g | Freq: Once | INTRAMUSCULAR | Status: AC
  Administered 2023-06-11: 1 g via INTRAMUSCULAR

## 2023-06-11 MED ORDER — FLUCONAZOLE 150 MG PO TABS: ORAL_TABLET | ORAL | 0 refills | Status: DC

## 2023-06-11 NOTE — ED Triage Notes (Signed)
 Pt c/o vaginal discharge, vaginal odor, dysuria x 1 month. Pt was seen 05/16/23 and states her symptoms went away initially while taking the medication, but returned after.
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
## 2023-06-11 NOTE — Discharge Instructions (Addendum)
 Take home meds as prescribed.  You urine is positive for UTI.  You have been treated for yeast infection as well, check my chart for remainder of results from swab, if further treatment is indicated you will be notified.  Keep appointment with PCP for referral to endocrinology for further management of diabetes.  May benefit from diabetes education/nutrition referral.  Drink plenty of water, follow up with PCP.   Go to Er for new or worsening issues or concerns(fever, nausea, vomiting, unable to keep meds down, muscle aches, etc)Avoid baths, hot tubs and whirlpool spas.  Don't use scented or harsh soaps, such as those with deodorant or antibacterial action. Avoid irritants. These include scented tampons and pads. Wipe from front to back after using the toilet.  Don't douche. Your vagina doesn't require cleansing other than normal bathing.  Use a  condom. Wear cotton underwear, this fabric helps absorb moisture

## 2023-06-12 ENCOUNTER — Telehealth: Payer: Self-pay

## 2023-06-12 LAB — CERVICOVAGINAL ANCILLARY ONLY
Bacterial Vaginitis (gardnerella): POSITIVE — AB
Candida Glabrata: NEGATIVE
Candida Vaginitis: POSITIVE — AB
Comment: NEGATIVE
Comment: NEGATIVE
Comment: NEGATIVE

## 2023-06-12 MED ORDER — METRONIDAZOLE 500 MG PO TABS: 500.0000 mg | ORAL_TABLET | Freq: Two times a day (BID) | ORAL | 0 refills | Status: AC

## 2023-06-12 NOTE — Telephone Encounter (Signed)
 Per protocol, pt requires tx with metronidazole. Rx sent to pharmacy on file.

## 2023-06-14 LAB — URINE CULTURE
Culture: >=100000 — AB
Special Requests: NORMAL

## 2023-06-30 ENCOUNTER — Encounter: Payer: Self-pay | Admitting: Nurse Practitioner

## 2023-06-30 ENCOUNTER — Ambulatory Visit (LOCAL_COMMUNITY_HEALTH_CENTER): Payer: Self-pay | Admitting: Nurse Practitioner

## 2023-06-30 VITALS — BP 112/80 | HR 97 | Ht 65.0 in | Wt 123.0 lb

## 2023-06-30 DIAGNOSIS — Z30013 Encounter for initial prescription of injectable contraceptive: Secondary | ICD-10-CM | POA: Diagnosis not present

## 2023-06-30 DIAGNOSIS — Z3009 Encounter for other general counseling and advice on contraception: Secondary | ICD-10-CM

## 2023-06-30 DIAGNOSIS — Z309 Encounter for contraceptive management, unspecified: Secondary | ICD-10-CM | POA: Diagnosis not present

## 2023-06-30 MED ORDER — MEDROXYPROGESTERONE ACETATE 150 MG/ML IM SUSP
150.0000 mg | INTRAMUSCULAR | Status: AC
Start: 2023-06-30 — End: 2024-09-22
  Administered 2023-06-30 – 2024-04-20 (×4): 150 mg via INTRAMUSCULAR

## 2023-06-30 NOTE — Progress Notes (Signed)
 Pt here for physical and depo provera injection.  Declines pelvic exam today due to bleeding.  Depo-Provera 150mg  IM given without difficulty.  To return for pap smear and Depo in 11 weeks.  Reminder card given.  Family planning packet given.  Declines Velda Shell, RN
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
## 2023-07-14 ENCOUNTER — Encounter: Payer: Self-pay | Admitting: Family Medicine

## 2023-07-14 DIAGNOSIS — Z62810 Personal history of physical and sexual abuse in childhood: Secondary | ICD-10-CM | POA: Insufficient documentation

## 2023-07-17 ENCOUNTER — Ambulatory Visit

## 2023-07-18 ENCOUNTER — Ambulatory Visit

## 2023-07-19 ENCOUNTER — Ambulatory Visit
Admission: RE | Admit: 2023-07-19 | Discharge: 2023-07-19 | Disposition: A | Discharge status: Home / Self Care | Source: Ambulatory Visit | Attending: Physician Assistant

## 2023-07-19 VITALS — BP 109/73 | HR 88 | Temp 98.6°F | Resp 14 | Ht 65.0 in | Wt 123.0 lb

## 2023-07-19 DIAGNOSIS — B9689 Other specified bacterial agents as the cause of diseases classified elsewhere: Secondary | ICD-10-CM | POA: Diagnosis present

## 2023-07-19 DIAGNOSIS — R300 Dysuria: Secondary | ICD-10-CM | POA: Diagnosis present

## 2023-07-19 DIAGNOSIS — R739 Hyperglycemia, unspecified: Secondary | ICD-10-CM | POA: Diagnosis present

## 2023-07-19 DIAGNOSIS — R81 Glycosuria: Secondary | ICD-10-CM | POA: Diagnosis present

## 2023-07-19 DIAGNOSIS — N760 Acute vaginitis: Secondary | ICD-10-CM | POA: Diagnosis present

## 2023-07-19 LAB — URINALYSIS, W/ REFLEX TO CULTURE (INFECTION SUSPECTED)
Bilirubin Urine: NEGATIVE
Glucose, UA: >=500 mg/dL — AB
Hgb urine dipstick: NEGATIVE
Ketones, ur: NEGATIVE mg/dL
Leukocytes,Ua: NEGATIVE
Nitrite: NEGATIVE
Protein, ur: NEGATIVE mg/dL
RBC / HPF: NONE SEEN RBC/hpf (ref 0–5)
Specific Gravity, Urine: 1.01 (ref 1.005–1.030)
pH: 5 (ref 5.0–8.0)

## 2023-07-19 LAB — GLUCOSE, CAPILLARY: Glucose-Capillary: 366 mg/dL — ABNORMAL HIGH (ref 70–99)

## 2023-07-19 MED ORDER — CLINDAMYCIN HCL 300 MG PO CAPS: 300.0000 mg | ORAL_CAPSULE | Freq: Two times a day (BID) | ORAL | 0 refills | Status: AC

## 2023-07-19 MED ORDER — PHENAZOPYRIDINE HCL 200 MG PO TABS: 200.0000 mg | ORAL_TABLET | Freq: Three times a day (TID) | ORAL | 0 refills | Status: DC

## 2023-07-19 NOTE — ED Triage Notes (Signed)
 Patient reports vaginal discharge and strong urine odor that started 4 days ago.  Patient has history of BV and yeast infection. Patient unsure of fevers.
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
## 2023-07-19 NOTE — Discharge Instructions (Addendum)
 Your urinalysis did not show evidence of a UTI on the urine dip though there was bacteria present when viewed under the microscope.  This may be coming from your vaginal vault.  I will treat you presumptively for bacterial vaginosis with clindamycin 300 mg twice daily for 7 days.  Make sure you are taking this with food.  Use the Pyridium every 8 hours as needed for urinary discomfort.  Make sure that you are taking your diabetic medication as directed as you have a large amount of sugar in your urine, which could be contributing to the burning you are feeling, as well as a high sugar level in your blood.  Your vaginal swab will be back in the next 1 to 2 days and if you are positive for any infection we will contact you by phone and treatment options will be provided.  If your results are negative they will appear in your MyChart.  If you develop any new or worsening symptoms other return for reevaluation or follow-up with your primary care provider.

## 2023-07-21 ENCOUNTER — Telehealth: Payer: Self-pay | Admitting: Emergency Medicine

## 2023-07-21 LAB — URINE CULTURE: Culture: >=100000 — AB

## 2023-07-21 MED ORDER — NITROFURANTOIN MONOHYD MACRO 100 MG PO CAPS
100.0000 mg | ORAL_CAPSULE | Freq: Two times a day (BID) | ORAL | 0 refills | Status: AC
Start: 2023-07-21 — End: ?

## 2023-07-21 NOTE — Telephone Encounter (Signed)
 Urine culture grew out E. coli.  Patient sent home on clindamycin for treatment of BV.  I will send Macrobid to the pharmacy, 100 mg twice daily for 5 days for treatment of UTI.

## 2023-07-23 ENCOUNTER — Telehealth (HOSPITAL_COMMUNITY): Payer: Self-pay

## 2023-07-23 LAB — CERVICOVAGINAL ANCILLARY ONLY
Bacterial Vaginitis (gardnerella): POSITIVE — AB
Candida Glabrata: NEGATIVE
Candida Vaginitis: POSITIVE — AB
Chlamydia: NEGATIVE
Comment: NEGATIVE
Comment: NEGATIVE
Comment: NEGATIVE
Comment: NEGATIVE
Comment: NEGATIVE
Comment: NORMAL
Neisseria Gonorrhea: NEGATIVE
Trichomonas: NEGATIVE

## 2023-07-23 MED ORDER — FLUCONAZOLE 150 MG PO TABS: 150.0000 mg | ORAL_TABLET | Freq: Once | ORAL | 0 refills | Status: AC

## 2023-07-23 NOTE — Telephone Encounter (Signed)
 Per protocol, pt requires tx with Diflucan.  Rx sent to pharmacy on file.

## 2023-08-15 ENCOUNTER — Ambulatory Visit
Admission: RE | Admit: 2023-08-15 | Discharge: 2023-08-15 | Disposition: A | Discharge status: Home / Self Care | Payer: Self-pay | Source: Ambulatory Visit | Attending: Physician Assistant | Admitting: Physician Assistant

## 2023-08-15 VITALS — BP 109/74 | HR 96 | Temp 98.7°F | Resp 14 | Ht 65.0 in | Wt 123.0 lb

## 2023-08-15 DIAGNOSIS — N760 Acute vaginitis: Secondary | ICD-10-CM

## 2023-08-15 MED ORDER — FLUCONAZOLE 150 MG PO TABS: ORAL_TABLET | ORAL | 0 refills | Status: DC

## 2023-08-15 MED ORDER — METRONIDAZOLE 500 MG PO TABS: 500.0000 mg | ORAL_TABLET | Freq: Two times a day (BID) | ORAL | 0 refills | Status: AC

## 2023-08-15 NOTE — ED Triage Notes (Signed)
 Patient c/o vaginal discharge and irritation that started 2 days ago.  Patient reports white cottage cheese like vaginal discharge.  Patient has history of BV
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
## 2023-08-15 NOTE — Discharge Instructions (Addendum)

## 2023-08-21 LAB — CERVICOVAGINAL ANCILLARY ONLY
Bacterial Vaginitis (gardnerella): POSITIVE — AB
Candida Glabrata: NEGATIVE
Candida Vaginitis: POSITIVE — AB
Chlamydia: NEGATIVE
Comment: NEGATIVE
Comment: NEGATIVE
Comment: NEGATIVE
Comment: NEGATIVE
Comment: NEGATIVE
Comment: NORMAL
Neisseria Gonorrhea: NEGATIVE
Trichomonas: NEGATIVE

## 2023-08-22 ENCOUNTER — Ambulatory Visit (HOSPITAL_COMMUNITY): Payer: Self-pay

## 2023-09-15 ENCOUNTER — Ambulatory Visit: Payer: Self-pay

## 2023-09-16 ENCOUNTER — Ambulatory Visit (LOCAL_COMMUNITY_HEALTH_CENTER): Payer: Self-pay

## 2023-09-16 VITALS — BP 128/78 | Ht 65.0 in | Wt 121.5 lb

## 2023-09-16 DIAGNOSIS — Z3042 Encounter for surveillance of injectable contraceptive: Secondary | ICD-10-CM

## 2023-09-16 DIAGNOSIS — Z30013 Encounter for initial prescription of injectable contraceptive: Secondary | ICD-10-CM | POA: Diagnosis not present

## 2023-09-16 DIAGNOSIS — Z3009 Encounter for other general counseling and advice on contraception: Secondary | ICD-10-CM

## 2023-09-16 NOTE — Progress Notes (Signed)
 11 Weeks   1 Days since last Depo    Patient explains she has been bleeding daily since starting depo. Patient states frustration as she has been bleeding for months.  Sometimes heavy and with cramping. Takes ibuprofen  800 mg when notices bleeding, usually once or twice/day. Bleeding then stops after taking ibuprofen  but then bleeding returns.   Patient has hx diabetes and states blood sugars have been WNL, has regular f-u with PCP [facility]. RN counseled that pap is due and patient plans to have it done with her PCP.   Consult [facility], FNP and informed of patient status and frequent bleeding.  Provider recommends when patient starts to bleed, she can take ibuprofen  800 mg three times daily (every 8 hrs) for 5 days straight. She may take this ibuprofen  regimen up to #3 times, but no more. Due to patient risk factors, provider recommends patient to f-u with PCP.   RN explained provider recommendations. Patient in agreement and verbalizes understanding. Wants to continue depo today, but if bleeding continues, will consider other options. RN wrote down ibuprofen  regimen for patient. RN counseled to notify [facility] if questions, concerns, and if bleeding continues to be heavy after ibuprofen  regimen. Patient also states plans to f-u with PCP.   Depo given today per order by Claryce Cruel, FNP  dated 06/30/2023.  Tolerated well R delt.  Next depo due 12/02/2023.  has reminder card.  Kisean Rollo, RN

## 2023-09-23 ENCOUNTER — Ambulatory Visit
Admission: RE | Admit: 2023-09-23 | Discharge: 2023-09-23 | Disposition: A | Discharge status: Home / Self Care | Payer: Self-pay | Source: Ambulatory Visit | Attending: Emergency Medicine | Admitting: Emergency Medicine

## 2023-09-23 VITALS — BP 116/77 | HR 100 | Temp 98.6°F | Resp 16

## 2023-09-23 DIAGNOSIS — J0110 Acute frontal sinusitis, unspecified: Secondary | ICD-10-CM | POA: Diagnosis not present

## 2023-09-23 DIAGNOSIS — J441 Chronic obstructive pulmonary disease with (acute) exacerbation: Secondary | ICD-10-CM | POA: Insufficient documentation

## 2023-09-23 DIAGNOSIS — Z113 Encounter for screening for infections with a predominantly sexual mode of transmission: Secondary | ICD-10-CM | POA: Diagnosis present

## 2023-09-23 DIAGNOSIS — N760 Acute vaginitis: Secondary | ICD-10-CM | POA: Insufficient documentation

## 2023-09-23 DIAGNOSIS — J22 Unspecified acute lower respiratory infection: Secondary | ICD-10-CM | POA: Insufficient documentation

## 2023-09-23 MED ORDER — PROMETHAZINE-DM 6.25-15 MG/5ML PO SYRP: 5.0000 mL | ORAL_SOLUTION | Freq: Four times a day (QID) | ORAL | 0 refills | Status: DC | PRN

## 2023-09-23 MED ORDER — FLUCONAZOLE 150 MG PO TABS: 150.0000 mg | ORAL_TABLET | Freq: Once | ORAL | 1 refills | Status: AC

## 2023-09-23 MED ORDER — AEROCHAMBER MV MISC: 1 refills | Status: DC

## 2023-09-23 MED ORDER — AMOXICILLIN-POT CLAVULANATE 875-125 MG PO TABS: 1.0000 | ORAL_TABLET | Freq: Two times a day (BID) | ORAL | 0 refills | Status: DC

## 2023-09-23 MED ORDER — FLUTICASONE PROPIONATE 50 MCG/ACT NA SUSP: 2.0000 | Freq: Every day | NASAL | 0 refills | Status: DC

## 2023-09-23 MED ORDER — ALBUTEROL SULFATE HFA 108 (90 BASE) MCG/ACT IN AERS: 1.0000 | INHALATION_SPRAY | RESPIRATORY_TRACT | 0 refills | Status: DC | PRN

## 2023-09-23 MED ORDER — METRONIDAZOLE 500 MG PO TABS
500.0000 mg | ORAL_TABLET | Freq: Two times a day (BID) | ORAL | 0 refills | Status: AC
Start: 2023-09-23 — End: 2023-09-30

## 2023-09-23 MED ORDER — PREDNISONE 20 MG PO TABS: 40.0000 mg | ORAL_TABLET | Freq: Every day | ORAL | 0 refills | Status: AC
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
## 2023-09-23 NOTE — Discharge Instructions (Signed)
 Take two puffs from your albuterol inhaler with your spacer every 4 hours for 2 days, then every 6 hours for 2 days, then as needed. You can back off if you start to improve  sooner. Finish the steroids unless your doctor tells you to stop. Finish the antibiotics, even if you feel better. Take tylenol  1 gram combined with  600 mg of motrin  up to 3-4 times a day as needed for pain. Make sure you drink extra fluids.  Promethazine DM for cough.  Follow-up with your primary care provider if you are not getting better after finishing the prednisone , or if you are getting worse for a chest x-ray.  We did not do a chest x-ray today as it would not change management.  Start Mucinex-D to keep the mucous thin and to decongest you.  Flonase. Use a NeilMed sinus rinse with distilled water as often as you want to to reduce nasal congestion. Follow the directions on the box.   If the spacer is too expensive at the pharmacy, you can get an AeroChamber Z-Stat off of [employer] for about $10-$15.Flagyl  and Diflucan  for BV and yeast respectively.  We are also checking for other STDs.  Refrain from intercourse until all of your labs have been resulted, your symptoms have resolved and you have finished the medication.  Go to www.goodrx.com  or www.costplusdrugs.com to look up your medications. This will give you a list of where you can find your prescriptions at the most affordable prices. Or ask the pharmacist what the cash price is, or if they have any other discount programs available to help make your medication more affordable. This can be less expensive than what you would pay with insurance.

## 2023-09-23 NOTE — ED Triage Notes (Addendum)
 Pt presents with a cough and SOB x 3 days. Pt has tried OTC cold medication. Pt would also like to tested for BV and yeast.

## 2023-09-25 ENCOUNTER — Ambulatory Visit (HOSPITAL_COMMUNITY): Payer: Self-pay

## 2023-09-25 LAB — CERVICOVAGINAL ANCILLARY ONLY
Bacterial Vaginitis (gardnerella): POSITIVE — AB
Candida Glabrata: NEGATIVE
Candida Vaginitis: NEGATIVE
Chlamydia: NEGATIVE
Comment: NEGATIVE
Comment: NEGATIVE
Comment: NEGATIVE
Comment: NEGATIVE
Comment: NEGATIVE
Comment: NORMAL
Neisseria Gonorrhea: NEGATIVE
Trichomonas: NEGATIVE

## 2023-11-17 ENCOUNTER — Ambulatory Visit: Payer: Self-pay

## 2023-11-18 ENCOUNTER — Other Ambulatory Visit: Payer: Self-pay

## 2023-11-18 ENCOUNTER — Ambulatory Visit
Admission: RE | Admit: 2023-11-18 | Discharge: 2023-11-18 | Disposition: A | Discharge status: Home / Self Care | Source: Ambulatory Visit | Attending: Physician Assistant | Admitting: Physician Assistant

## 2023-11-18 VITALS — BP 109/74 | HR 82 | Temp 99.2°F | Resp 17

## 2023-11-18 DIAGNOSIS — R300 Dysuria: Secondary | ICD-10-CM | POA: Diagnosis not present

## 2023-11-18 DIAGNOSIS — N760 Acute vaginitis: Secondary | ICD-10-CM | POA: Diagnosis not present

## 2023-11-18 LAB — URINALYSIS, W/ REFLEX TO CULTURE (INFECTION SUSPECTED)
Bilirubin Urine: NEGATIVE
Glucose, UA: >=500 mg/dL — AB
Ketones, ur: NEGATIVE mg/dL
Leukocytes,Ua: NEGATIVE
Nitrite: NEGATIVE
Protein, ur: NEGATIVE mg/dL
Specific Gravity, Urine: <1.005 — ABNORMAL LOW (ref 1.005–1.030)
pH: 5 (ref 5.0–8.0)

## 2023-11-18 MED ORDER — FLUCONAZOLE 150 MG PO TABS: ORAL_TABLET | ORAL | 0 refills | Status: DC

## 2023-11-18 MED ORDER — METRONIDAZOLE 500 MG PO TABS: 500.0000 mg | ORAL_TABLET | Freq: Two times a day (BID) | ORAL | 0 refills | Status: AC

## 2023-11-18 NOTE — Discharge Instructions (Signed)
 The most common types of vaginal infections are yeast infections and bacterial vaginosis. Neither of which are really considered to be sexually transmitted. Often a pH swab or wet prep is performed and if abnormal may reveal either type of infection. Begin metronidazole  if prescribed for possible BV infection. If there is concern for yeast infection, fluconazole  is often prescribed . Take this as directed. You may also apply topical miconazole (can be purchased OTC) externally for relief of itching. Increase rest and fluid intake. If labs sent out, we will call within 2-5 days with results and amend treatment if necessary. Always try to use pH balanced washes/wipes, urinate after intercourse, stay hydrated, and take probiotics if you are prone to vaginal infections. Return or see PCP or gynecologist for new/worsening infections.     -There is a lot of glucose in your urine so make sure you are checking your blood sugar regularly and taking diabetes meds.

## 2023-11-18 NOTE — ED Triage Notes (Signed)
 Pt is here with 3 days of vaginal discharge gray like with burning after urination. Pt has not taken any meds to relieve discomfort.
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
## 2023-11-21 ENCOUNTER — Ambulatory Visit: Payer: Self-pay

## 2023-11-21 LAB — CERVICOVAGINAL ANCILLARY ONLY
Bacterial Vaginitis (gardnerella): POSITIVE — AB
Comment: NEGATIVE
Comment: NEGATIVE
Comment: NEGATIVE

## 2023-12-04 ENCOUNTER — Ambulatory Visit: Payer: Self-pay

## 2023-12-04 VITALS — BP 138/78 | Ht 65.0 in | Wt 114.0 lb

## 2023-12-04 DIAGNOSIS — Z309 Encounter for contraceptive management, unspecified: Secondary | ICD-10-CM

## 2023-12-04 DIAGNOSIS — Z3009 Encounter for other general counseling and advice on contraception: Secondary | ICD-10-CM

## 2023-12-04 DIAGNOSIS — Z3042 Encounter for surveillance of injectable contraceptive: Secondary | ICD-10-CM

## 2023-12-04 DIAGNOSIS — Z30013 Encounter for initial prescription of injectable contraceptive: Secondary | ICD-10-CM | POA: Diagnosis not present

## 2023-12-04 NOTE — Progress Notes (Signed)
 11 Weeks   2 Days since last Depo   Voices no concerns today.  Counseled to adhere to 11 to 13 week intervals between depo injections for optimal benefit.  Depo given today per order by JINNY Narrow, FNP  dated 06/30/2023.  Depo given by ONEIDA Glatter, RN. Tolerated well L deltoid. SABRA  Next depo due 02/19/2024,  has reminder card.  Hasset Chaviano, RN

## 2023-12-09 ENCOUNTER — Ambulatory Visit
Admission: RE | Admit: 2023-12-09 | Discharge: 2023-12-09 | Disposition: A | Discharge status: Home / Self Care | Source: Ambulatory Visit | Attending: Emergency Medicine | Admitting: Emergency Medicine

## 2023-12-09 VITALS — BP 108/79 | HR 97 | Temp 98.6°F | Resp 17 | Wt 112.0 lb

## 2023-12-09 DIAGNOSIS — N898 Other specified noninflammatory disorders of vagina: Secondary | ICD-10-CM | POA: Diagnosis present

## 2023-12-09 MED ORDER — FLUCONAZOLE 150 MG PO TABS
150.0000 mg | ORAL_TABLET | ORAL | 0 refills | Status: AC
Start: 2023-12-09 — End: 2023-12-17

## 2023-12-09 MED ORDER — METRONIDAZOLE 500 MG PO TABS: 500.0000 mg | ORAL_TABLET | Freq: Two times a day (BID) | ORAL | 0 refills | Status: DC

## 2023-12-09 NOTE — Discharge Instructions (Signed)
 Today you are being treated for bacterial vaginosis and yeast  Take Metronidazole  500 mg twice a day for 7 days, do not drink alcohol while using medication, this will make you feel sick   Take 1 Diflucan  tablet today and then take second dose after completion of all antibiotics in 1 week  Bacterial vaginosis which results from an overgrowth of one on several organisms that are normally present in your vagina.  Yeast can occur when the vaginal pH is altered.  Vaginosis is an inflammation of the vagina that can result in discharge, itching and pain.  Labs pending 2-3 days, you will be contacted if positive for any sti and treatment will be sent to the pharmacy, you will have to return to the clinic if positive for gonorrhea to receive treatment   Please refrain from having sex until labs results, if positive please refrain from having sex until treatment complete and symptoms resolve   If positive for , Chlamydia  gonorrhea or trichomoniasis please notify partner or partners so they may tested as well  Moving forward, it is recommended you use some form of protection against the transmission of sti infections  such as condoms or dental dams with each sexual encounter     In addition: Avoid baths, hot tubs and whirlpool spas.  Don't use scented or harsh soaps Avoid irritants. These include scented tampons and pads. Wipe from front to back after using the toilet. Don't douche. Your vagina doesn't require cleansing other than normal bathing.  Use a condom.  Wear cotton underwear, this fabric absorbs some moisture.
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
## 2023-12-09 NOTE — ED Triage Notes (Addendum)
 Patient stats that she's still having BV and yeast sx from last visit. Patient did finish medicine. Patient had sex while taking meds.   Abdominal pain Vaginal discharge

## 2023-12-11 LAB — CERVICOVAGINAL ANCILLARY ONLY
Bacterial Vaginitis (gardnerella): POSITIVE — AB
Candida Glabrata: NEGATIVE
Candida Vaginitis: POSITIVE — AB
Chlamydia: NEGATIVE
Comment: NEGATIVE
Comment: NEGATIVE
Comment: NEGATIVE
Comment: NEGATIVE
Comment: NEGATIVE
Comment: NORMAL
Neisseria Gonorrhea: NEGATIVE
Trichomonas: NEGATIVE

## 2023-12-12 ENCOUNTER — Ambulatory Visit (HOSPITAL_COMMUNITY): Payer: Self-pay

## 2024-01-03 ENCOUNTER — Ambulatory Visit: Payer: Self-pay

## 2024-01-05 ENCOUNTER — Ambulatory Visit
Admission: RE | Admit: 2024-01-05 | Discharge: 2024-01-05 | Disposition: A | Discharge status: Home / Self Care | Payer: Self-pay | Source: Ambulatory Visit | Attending: Emergency Medicine | Admitting: Emergency Medicine

## 2024-01-05 VITALS — BP 120/81 | HR 74 | Temp 98.2°F | Resp 18

## 2024-01-05 DIAGNOSIS — R1030 Lower abdominal pain, unspecified: Secondary | ICD-10-CM | POA: Insufficient documentation

## 2024-01-05 DIAGNOSIS — N898 Other specified noninflammatory disorders of vagina: Secondary | ICD-10-CM | POA: Diagnosis present

## 2024-01-05 DIAGNOSIS — N390 Urinary tract infection, site not specified: Secondary | ICD-10-CM | POA: Diagnosis present

## 2024-01-05 LAB — POCT URINE DIPSTICK
Bilirubin, UA: NEGATIVE
Glucose, UA: >=1000 mg/dL — AB
Ketones, POC UA: NEGATIVE mg/dL
Nitrite, UA: POSITIVE — AB
POC PROTEIN,UA: NEGATIVE
Spec Grav, UA: 1.01 (ref 1.010–1.025)
Urobilinogen, UA: 0.2 U/dL
pH, UA: 6 (ref 5.0–8.0)

## 2024-01-05 LAB — POCT URINE PREGNANCY: Preg Test, Ur: NEGATIVE

## 2024-01-05 MED ORDER — CEPHALEXIN 500 MG PO CAPS: 500.0000 mg | ORAL_CAPSULE | Freq: Three times a day (TID) | ORAL | 0 refills | Status: AC
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
## 2024-01-05 NOTE — Discharge Instructions (Addendum)
 Take the antibiotic as directed.  The urine culture is pending.  We will call you if it shows the need to change or discontinue your antibiotic.   Your vaginal test are pending. Please schedule an appointment with your primary care provider or a gynecologist for your recurrent vaginal symptoms.   Go to the emergency department if you have worsening symptoms.

## 2024-01-05 NOTE — ED Triage Notes (Signed)
 Patient to Urgent Care with complaints of lower abdominal pain/ dysuria/ strong smelling an cloudy urine/ vaginal discharge (believes she has BV and yeast).  Symptoms x4 days.

## 2024-01-07 ENCOUNTER — Ambulatory Visit (HOSPITAL_COMMUNITY): Payer: Self-pay

## 2024-01-07 LAB — CERVICOVAGINAL ANCILLARY ONLY
Bacterial Vaginitis (gardnerella): POSITIVE — AB
Candida Glabrata: NEGATIVE
Candida Vaginitis: NEGATIVE
Comment: NEGATIVE
Comment: NEGATIVE
Comment: NEGATIVE

## 2024-01-07 LAB — URINE CULTURE: Culture: >=100000 — AB

## 2024-01-07 MED ORDER — METRONIDAZOLE 500 MG PO TABS: 500.0000 mg | ORAL_TABLET | Freq: Two times a day (BID) | ORAL | 0 refills | Status: AC

## 2024-01-09 MED ORDER — FLUCONAZOLE 150 MG PO TABS
150.0000 mg | ORAL_TABLET | Freq: Once | ORAL | 0 refills | Status: AC
Start: 2024-01-09 — End: 2024-01-09

## 2024-04-14 ENCOUNTER — Encounter: Payer: Self-pay | Admitting: Emergency Medicine

## 2024-04-14 ENCOUNTER — Other Ambulatory Visit: Payer: Self-pay

## 2024-04-14 DIAGNOSIS — B9689 Other specified bacterial agents as the cause of diseases classified elsewhere: Secondary | ICD-10-CM | POA: Insufficient documentation

## 2024-04-14 DIAGNOSIS — N760 Acute vaginitis: Secondary | ICD-10-CM | POA: Diagnosis not present

## 2024-04-14 DIAGNOSIS — Z794 Long term (current) use of insulin: Secondary | ICD-10-CM | POA: Diagnosis not present

## 2024-04-14 DIAGNOSIS — R300 Dysuria: Secondary | ICD-10-CM | POA: Diagnosis present

## 2024-04-14 DIAGNOSIS — E1165 Type 2 diabetes mellitus with hyperglycemia: Secondary | ICD-10-CM | POA: Diagnosis not present

## 2024-04-14 LAB — URINALYSIS, ROUTINE W REFLEX MICROSCOPIC
Bacteria, UA: NONE SEEN
Bilirubin Urine: NEGATIVE
Glucose, UA: >=500 mg/dL — AB
Hgb urine dipstick: NEGATIVE
Ketones, ur: NEGATIVE mg/dL
Leukocytes,Ua: NEGATIVE
Nitrite: NEGATIVE
Protein, ur: NEGATIVE mg/dL
Specific Gravity, Urine: 1.029 (ref 1.005–1.030)
pH: 6 (ref 5.0–8.0)

## 2024-04-14 LAB — POC URINE PREG, ED: Preg Test, Ur: NEGATIVE

## 2024-04-14 NOTE — ED Notes (Signed)
 Pt states she did not want to tell the front desk but wishes to be check for STDS and BV.

## 2024-04-15 ENCOUNTER — Emergency Department
Admission: EM | Admit: 2024-04-15 | Discharge: 2024-04-15 | Disposition: A | Discharge status: Home / Self Care | Attending: Emergency Medicine | Admitting: Emergency Medicine

## 2024-04-15 DIAGNOSIS — B9689 Other specified bacterial agents as the cause of diseases classified elsewhere: Secondary | ICD-10-CM

## 2024-04-15 DIAGNOSIS — R739 Hyperglycemia, unspecified: Secondary | ICD-10-CM

## 2024-04-15 LAB — CHLAMYDIA/NGC RT PCR (ARMC ONLY)
Chlamydia Tr: NOT DETECTED
N gonorrhoeae: NOT DETECTED

## 2024-04-15 LAB — CBC WITH DIFFERENTIAL/PLATELET
Abs Immature Granulocytes: 0.07 K/uL (ref 0.00–0.07)
Basophils Absolute: 0.1 K/uL (ref 0.0–0.1)
Basophils Relative: 1 %
Eosinophils Absolute: 0.1 K/uL (ref 0.0–0.5)
Eosinophils Relative: 1 %
HCT: 39.9 % (ref 36.0–46.0)
Hemoglobin: 13.5 g/dL (ref 12.0–15.0)
Immature Granulocytes: 1 %
Lymphocytes Relative: 21 %
Lymphs Abs: 2.8 K/uL (ref 0.7–4.0)
MCH: 31 pg (ref 26.0–34.0)
MCHC: 33.8 g/dL (ref 30.0–36.0)
MCV: 91.7 fL (ref 80.0–100.0)
Monocytes Absolute: 0.8 K/uL (ref 0.1–1.0)
Monocytes Relative: 6 %
Neutro Abs: 9.7 K/uL — ABNORMAL HIGH (ref 1.7–7.7)
Neutrophils Relative %: 70 %
Platelets: 228 K/uL (ref 150–400)
RBC: 4.35 MIL/uL (ref 3.87–5.11)
RDW: 12.7 % (ref 11.5–15.5)
WBC: 13.6 K/uL — ABNORMAL HIGH (ref 4.0–10.5)
nRBC: 0 % (ref 0.0–0.2)

## 2024-04-15 LAB — CBG MONITORING, ED
Glucose-Capillary: 302 mg/dL — ABNORMAL HIGH (ref 70–99)
Glucose-Capillary: 386 mg/dL — ABNORMAL HIGH (ref 70–99)

## 2024-04-15 LAB — BASIC METABOLIC PANEL WITH GFR
Anion gap: 11 (ref 5–15)
BUN: 9 mg/dL (ref 6–20)
CO2: 26 mmol/L (ref 22–32)
Calcium: 9 mg/dL (ref 8.9–10.3)
Chloride: 96 mmol/L — ABNORMAL LOW (ref 98–111)
Creatinine, Ser: 0.64 mg/dL (ref 0.44–1.00)
GFR, Estimated: >60 mL/min
Glucose, Bld: 414 mg/dL — ABNORMAL HIGH (ref 70–99)
Potassium: 3.8 mmol/L (ref 3.5–5.1)
Sodium: 133 mmol/L — ABNORMAL LOW (ref 135–145)

## 2024-04-15 LAB — WET PREP, GENITAL
Sperm: NONE SEEN
Trich, Wet Prep: NONE SEEN
WBC, Wet Prep HPF POC: >=10 — AB
Yeast Wet Prep HPF POC: NONE SEEN

## 2024-04-15 MED ORDER — FOSFOMYCIN TROMETHAMINE 3 G PO PACK
3.0000 g | PACK | Freq: Once | ORAL | Status: AC
  Administered 2024-04-15: 3 g via ORAL
  Filled 2024-04-15: qty 3

## 2024-04-15 MED ORDER — METRONIDAZOLE 500 MG PO TABS: 500.0000 mg | ORAL_TABLET | Freq: Two times a day (BID) | ORAL | 1 refills | Status: DC

## 2024-04-15 MED ORDER — SODIUM CHLORIDE 0.9 % IV BOLUS (SEPSIS)
1000.0000 mL | Freq: Once | INTRAVENOUS | Status: AC
  Administered 2024-04-15: 1000 mL via INTRAVENOUS

## 2024-04-15 MED ORDER — FLUCONAZOLE 150 MG PO TABS: 150.0000 mg | ORAL_TABLET | Freq: Once | ORAL | 1 refills | Status: AC

## 2024-04-15 MED ORDER — METRONIDAZOLE 500 MG PO TABS: 500.0000 mg | ORAL_TABLET | Freq: Two times a day (BID) | ORAL | 1 refills | Status: AC

## 2024-04-15 MED ORDER — METRONIDAZOLE 500 MG PO TABS
500.0000 mg | ORAL_TABLET | Freq: Once | ORAL | Status: AC
  Administered 2024-04-15: 500 mg via ORAL
  Filled 2024-04-15: qty 1
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
## 2024-04-15 NOTE — Discharge Instructions (Addendum)
 You may alternate over the counter Tylenol 1000 mg every 6 hours as needed for pain, fever and Ibuprofen 800 mg every 6-8 hours as needed for pain, fever.  Please take Ibuprofen with food.  Do not take more than 4000 mg of Tylenol (acetaminophen) in a 24 hour period.

## 2024-04-16 LAB — URINE CULTURE: Culture: 30000 — AB

## 2024-04-20 ENCOUNTER — Ambulatory Visit: Admitting: Family Medicine

## 2024-04-20 VITALS — BP 123/70 | HR 90 | Ht 60.0 in | Wt 126.8 lb

## 2024-04-20 DIAGNOSIS — Z30013 Encounter for initial prescription of injectable contraceptive: Secondary | ICD-10-CM

## 2024-04-20 DIAGNOSIS — Z3009 Encounter for other general counseling and advice on contraception: Secondary | ICD-10-CM

## 2024-04-20 DIAGNOSIS — Z3042 Encounter for surveillance of injectable contraceptive: Secondary | ICD-10-CM

## 2024-04-20 NOTE — Progress Notes (Signed)
 1. Encounter for Depo-Provera  contraception (Primary) Pt is here today for a late depo injection. Reports regular LMP on 04/09/23. Last sex was with a condom 3 days ago. Ok to give depo today.  [facility] FNP-C

## 2024-04-20 NOTE — Progress Notes (Signed)
 Pt is here to restart Depo IM injection. Depo IM injection given to pt at the R[facility] Delto[facility] and pt tolerated well to the injection with no complications. Condoms declined and reminder card given. Wilkie Drought, RN.
